# Patient Record
Sex: Female | Born: 1985 | Race: White | Hispanic: No | Marital: Single | Smoking: Current every day smoker
Health system: Southern US, Community
[De-identification: ages and names within clinical notes are randomized; demographics above are authoritative.]

## PROBLEM LIST (undated history)

## (undated) DIAGNOSIS — E538 Deficiency of other specified B group vitamins: Secondary | ICD-10-CM

## (undated) DIAGNOSIS — E559 Vitamin D deficiency, unspecified: Secondary | ICD-10-CM

## (undated) DIAGNOSIS — F419 Anxiety disorder, unspecified: Secondary | ICD-10-CM

## (undated) DIAGNOSIS — F32A Depression, unspecified: Secondary | ICD-10-CM

## (undated) DIAGNOSIS — K219 Gastro-esophageal reflux disease without esophagitis: Secondary | ICD-10-CM

## (undated) DIAGNOSIS — I10 Essential (primary) hypertension: Secondary | ICD-10-CM

## (undated) HISTORY — DX: Anxiety disorder, unspecified: F41.9

## (undated) HISTORY — DX: Vitamin D deficiency, unspecified: E55.9

## (undated) HISTORY — DX: Depression, unspecified: F32.A

## (undated) HISTORY — DX: Gastro-esophageal reflux disease without esophagitis: K21.9

## (undated) HISTORY — DX: Deficiency of other specified B group vitamins: E53.8

## (undated) HISTORY — PX: APPENDECTOMY: SHX54

## (undated) HISTORY — PX: CHOLECYSTECTOMY: SHX55

---

## 2018-03-08 ENCOUNTER — Emergency Department (HOSPITAL_COMMUNITY): Payer: No Typology Code available for payment source

## 2018-03-08 ENCOUNTER — Other Ambulatory Visit: Payer: Self-pay

## 2018-03-08 ENCOUNTER — Emergency Department (HOSPITAL_COMMUNITY)
Admission: EM | Admit: 2018-03-08 | Discharge: 2018-03-08 | Disposition: A | Discharge status: Home / Self Care | Payer: No Typology Code available for payment source | Attending: Emergency Medicine | Admitting: Emergency Medicine

## 2018-03-08 ENCOUNTER — Encounter (HOSPITAL_COMMUNITY): Payer: Self-pay

## 2018-03-08 DIAGNOSIS — S0990XA Unspecified injury of head, initial encounter: Secondary | ICD-10-CM | POA: Diagnosis present

## 2018-03-08 DIAGNOSIS — R040 Epistaxis: Secondary | ICD-10-CM | POA: Diagnosis not present

## 2018-03-08 DIAGNOSIS — R109 Unspecified abdominal pain: Secondary | ICD-10-CM | POA: Diagnosis not present

## 2018-03-08 DIAGNOSIS — Y92410 Unspecified street and highway as the place of occurrence of the external cause: Secondary | ICD-10-CM | POA: Diagnosis not present

## 2018-03-08 DIAGNOSIS — S022XXA Fracture of nasal bones, initial encounter for closed fracture: Secondary | ICD-10-CM

## 2018-03-08 DIAGNOSIS — Y998 Other external cause status: Secondary | ICD-10-CM | POA: Diagnosis not present

## 2018-03-08 DIAGNOSIS — R079 Chest pain, unspecified: Secondary | ICD-10-CM | POA: Insufficient documentation

## 2018-03-08 DIAGNOSIS — S8001XA Contusion of right knee, initial encounter: Secondary | ICD-10-CM | POA: Insufficient documentation

## 2018-03-08 DIAGNOSIS — S20211A Contusion of right front wall of thorax, initial encounter: Secondary | ICD-10-CM | POA: Insufficient documentation

## 2018-03-08 DIAGNOSIS — S02401A Maxillary fracture, unspecified, initial encounter for closed fracture: Secondary | ICD-10-CM | POA: Insufficient documentation

## 2018-03-08 DIAGNOSIS — S81012A Laceration without foreign body, left knee, initial encounter: Secondary | ICD-10-CM | POA: Insufficient documentation

## 2018-03-08 DIAGNOSIS — Y9389 Activity, other specified: Secondary | ICD-10-CM | POA: Insufficient documentation

## 2018-03-08 DIAGNOSIS — I10 Essential (primary) hypertension: Secondary | ICD-10-CM | POA: Diagnosis not present

## 2018-03-08 HISTORY — DX: Essential (primary) hypertension: I10

## 2018-03-08 LAB — COMPREHENSIVE METABOLIC PANEL
ALT: 37 U/L (ref 14–54)
ANION GAP: 12 (ref 5–15)
AST: 62 U/L — ABNORMAL HIGH (ref 15–41)
Albumin: 4.2 g/dL (ref 3.5–5.0)
Alkaline Phosphatase: 64 U/L (ref 38–126)
BUN: <5 mg/dL — ABNORMAL LOW (ref 6–20)
CHLORIDE: 106 mmol/L (ref 101–111)
CO2: 20 mmol/L — AB (ref 22–32)
Calcium: 9 mg/dL (ref 8.9–10.3)
Creatinine, Ser: 0.63 mg/dL (ref 0.44–1.00)
GFR calc non Af Amer: >60 mL/min (ref >60)
Glucose, Bld: 113 mg/dL — ABNORMAL HIGH (ref 65–99)
POTASSIUM: 3.7 mmol/L (ref 3.5–5.1)
SODIUM: 138 mmol/L (ref 135–145)
Total Bilirubin: 0.6 mg/dL (ref 0.3–1.2)
Total Protein: 7.3 g/dL (ref 6.5–8.1)

## 2018-03-08 LAB — CBC
HEMATOCRIT: 42.7 % (ref 36.0–46.0)
HEMOGLOBIN: 13.6 g/dL (ref 12.0–15.0)
MCH: 28.2 pg (ref 26.0–34.0)
MCHC: 31.9 g/dL (ref 30.0–36.0)
MCV: 88.6 fL (ref 78.0–100.0)
Platelets: 318 10*3/uL (ref 150–400)
RBC: 4.82 MIL/uL (ref 3.87–5.11)
RDW: 14.6 % (ref 11.5–15.5)
WBC: 11.2 10*3/uL — ABNORMAL HIGH (ref 4.0–10.5)

## 2018-03-08 LAB — I-STAT CHEM 8, ED
BUN: 6 mg/dL (ref 6–20)
CALCIUM ION: 1.03 mmol/L — AB (ref 1.15–1.40)
Chloride: 108 mmol/L (ref 101–111)
Creatinine, Ser: 0.6 mg/dL (ref 0.44–1.00)
Glucose, Bld: 117 mg/dL — ABNORMAL HIGH (ref 65–99)
HCT: 43 % (ref 36.0–46.0)
HEMOGLOBIN: 14.6 g/dL (ref 12.0–15.0)
POTASSIUM: 4.5 mmol/L (ref 3.5–5.1)
Sodium: 139 mmol/L (ref 135–145)
TCO2: 23 mmol/L (ref 22–32)

## 2018-03-08 LAB — SAMPLE TO BLOOD BANK

## 2018-03-08 LAB — I-STAT BETA HCG BLOOD, ED (MC, WL, AP ONLY)

## 2018-03-08 LAB — PROTIME-INR
INR: 0.98
Prothrombin Time: 12.9 seconds (ref 11.4–15.2)

## 2018-03-08 LAB — ETHANOL: Alcohol, Ethyl (B): <10 mg/dL (ref <10)

## 2018-03-08 LAB — CDS SEROLOGY

## 2018-03-08 LAB — I-STAT CG4 LACTIC ACID, ED: Lactic Acid, Venous: 1.58 mmol/L (ref 0.5–1.9)

## 2018-03-08 MED ORDER — ONDANSETRON HCL 4 MG/2ML IJ SOLN: 4.0000 mg | Freq: Once | INTRAMUSCULAR | Status: DC

## 2018-03-08 MED ORDER — IBUPROFEN 800 MG PO TABS: 800.0000 mg | ORAL_TABLET | Freq: Three times a day (TID) | ORAL | 0 refills | Status: DC | PRN

## 2018-03-08 MED ORDER — TETANUS-DIPHTH-ACELL PERTUSSIS 5-2.5-18.5 LF-MCG/0.5 IM SUSP
0.5000 mL | Freq: Once | INTRAMUSCULAR | Status: AC
  Administered 2018-03-08: 0.5 mL via INTRAMUSCULAR
  Filled 2018-03-08: qty 0.5

## 2018-03-08 MED ORDER — IOPAMIDOL (ISOVUE-300) INJECTION 61%
INTRAVENOUS | Status: AC
  Administered 2018-03-08: 100 mL
  Filled 2018-03-08: qty 100

## 2018-03-08 MED ORDER — MORPHINE SULFATE (PF) 4 MG/ML IV SOLN: 4.0000 mg | Freq: Once | INTRAVENOUS | Status: DC

## 2018-03-08 MED ORDER — HYDROCODONE-ACETAMINOPHEN 5-325 MG PO TABS: 1.0000 | ORAL_TABLET | Freq: Four times a day (QID) | ORAL | 0 refills | Status: DC | PRN

## 2018-03-08 NOTE — ED Notes (Signed)
Pt returned from CT. Pt AOX4, vitals remain stable.
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
## 2018-03-08 NOTE — ED Notes (Signed)
Pt called family member, Jonny RuizJohn and notified him that she is here. Phone [phone]) [phone]

## 2018-03-08 NOTE — ED Notes (Signed)
Pt ambulated to the restroom. MD made aware.
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
## 2018-12-14 ENCOUNTER — Encounter (HOSPITAL_COMMUNITY): Payer: Self-pay

## 2018-12-14 ENCOUNTER — Other Ambulatory Visit: Payer: Self-pay

## 2018-12-14 DIAGNOSIS — R51 Headache: Secondary | ICD-10-CM | POA: Insufficient documentation

## 2018-12-14 DIAGNOSIS — L02412 Cutaneous abscess of left axilla: Secondary | ICD-10-CM | POA: Insufficient documentation

## 2018-12-14 DIAGNOSIS — F17200 Nicotine dependence, unspecified, uncomplicated: Secondary | ICD-10-CM | POA: Insufficient documentation

## 2018-12-14 DIAGNOSIS — I10 Essential (primary) hypertension: Secondary | ICD-10-CM | POA: Insufficient documentation

## 2018-12-14 DIAGNOSIS — H5711 Ocular pain, right eye: Secondary | ICD-10-CM | POA: Insufficient documentation

## 2018-12-14 DIAGNOSIS — J320 Chronic maxillary sinusitis: Secondary | ICD-10-CM | POA: Insufficient documentation

## 2018-12-14 DIAGNOSIS — Z79899 Other long term (current) drug therapy: Secondary | ICD-10-CM | POA: Insufficient documentation

## 2018-12-14 NOTE — ED Triage Notes (Signed)
Pt c/o of multiple abscess under left axilla; some  open sores are noted, with mild serous drainage  Pt reports hx of HTN and states that she has not been taking BP medication and has pain to rt eye.

## 2018-12-15 ENCOUNTER — Emergency Department (HOSPITAL_COMMUNITY)
Admission: EM | Admit: 2018-12-15 | Discharge: 2018-12-15 | Disposition: A | Discharge status: Home / Self Care | Payer: Self-pay | Attending: Emergency Medicine | Admitting: Emergency Medicine

## 2018-12-15 ENCOUNTER — Encounter (HOSPITAL_COMMUNITY): Payer: Self-pay

## 2018-12-15 ENCOUNTER — Emergency Department (HOSPITAL_COMMUNITY): Payer: Self-pay

## 2018-12-15 DIAGNOSIS — L02412 Cutaneous abscess of left axilla: Secondary | ICD-10-CM

## 2018-12-15 DIAGNOSIS — J320 Chronic maxillary sinusitis: Secondary | ICD-10-CM

## 2018-12-15 DIAGNOSIS — H5711 Ocular pain, right eye: Secondary | ICD-10-CM

## 2018-12-15 DIAGNOSIS — I10 Essential (primary) hypertension: Secondary | ICD-10-CM

## 2018-12-15 LAB — BASIC METABOLIC PANEL
Anion gap: 7 (ref 5–15)
BUN: 9 mg/dL (ref 6–20)
CO2: 25 mmol/L (ref 22–32)
Calcium: 8.6 mg/dL — ABNORMAL LOW (ref 8.9–10.3)
Chloride: 107 mmol/L (ref 98–111)
Creatinine, Ser: 0.52 mg/dL (ref 0.44–1.00)
GFR calc Af Amer: >60 mL/min (ref >60)
GFR calc non Af Amer: >60 mL/min (ref >60)
Glucose, Bld: 116 mg/dL — ABNORMAL HIGH (ref 70–99)
Potassium: 3.8 mmol/L (ref 3.5–5.1)
Sodium: 139 mmol/L (ref 135–145)

## 2018-12-15 LAB — CBC WITH DIFFERENTIAL/PLATELET
Abs Immature Granulocytes: 0.04 10*3/uL (ref 0.00–0.07)
Basophils Absolute: 0.1 10*3/uL (ref 0.0–0.1)
Basophils Relative: 1 %
Eosinophils Absolute: 0.1 10*3/uL (ref 0.0–0.5)
Eosinophils Relative: 1 %
HCT: 43 % (ref 36.0–46.0)
Hemoglobin: 14 g/dL (ref 12.0–15.0)
Immature Granulocytes: 0 %
Lymphocytes Relative: 17 %
Lymphs Abs: 1.7 10*3/uL (ref 0.7–4.0)
MCH: 29.7 pg (ref 26.0–34.0)
MCHC: 32.6 g/dL (ref 30.0–36.0)
MCV: 91.1 fL (ref 80.0–100.0)
Monocytes Absolute: 0.5 10*3/uL (ref 0.1–1.0)
Monocytes Relative: 5 %
Neutro Abs: 7.9 10*3/uL — ABNORMAL HIGH (ref 1.7–7.7)
Neutrophils Relative %: 76 %
Platelets: 370 10*3/uL (ref 150–400)
RBC: 4.72 MIL/uL (ref 3.87–5.11)
RDW: 13.2 % (ref 11.5–15.5)
WBC: 10.4 10*3/uL (ref 4.0–10.5)
nRBC: 0 % (ref 0.0–0.2)

## 2018-12-15 MED ORDER — DEXAMETHASONE SODIUM PHOSPHATE 10 MG/ML IJ SOLN
10.0000 mg | Freq: Once | INTRAMUSCULAR | Status: AC
  Administered 2018-12-15: 10 mg via INTRAVENOUS
  Filled 2018-12-15: qty 1

## 2018-12-15 MED ORDER — HYDROCHLOROTHIAZIDE 12.5 MG PO CAPS
12.5000 mg | ORAL_CAPSULE | Freq: Once | ORAL | Status: AC
  Administered 2018-12-15: 12.5 mg via ORAL
  Filled 2018-12-15: qty 1

## 2018-12-15 MED ORDER — DOXYCYCLINE HYCLATE 100 MG PO CAPS: 100.0000 mg | ORAL_CAPSULE | Freq: Two times a day (BID) | ORAL | 0 refills | Status: AC

## 2018-12-15 MED ORDER — LIDOCAINE-EPINEPHRINE (PF) 2 %-1:200000 IJ SOLN
20.0000 mL | Freq: Once | INTRAMUSCULAR | Status: AC
  Administered 2018-12-15: 10 mL via INTRADERMAL
  Filled 2018-12-15: qty 20

## 2018-12-15 MED ORDER — LIDOCAINE-EPINEPHRINE-TETRACAINE (LET) SOLUTION
3.0000 mL | Freq: Once | NASAL | Status: DC
Start: 2018-12-15 — End: 2018-12-15
  Filled 2018-12-15: qty 3

## 2018-12-15 MED ORDER — FLUORESCEIN SODIUM 1 MG OP STRP
1.0000 | ORAL_STRIP | Freq: Once | OPHTHALMIC | Status: AC
  Administered 2018-12-15: 1 via OPHTHALMIC
  Filled 2018-12-15: qty 1

## 2018-12-15 MED ORDER — DOXYCYCLINE HYCLATE 100 MG PO TABS
100.0000 mg | ORAL_TABLET | Freq: Once | ORAL | Status: AC
  Administered 2018-12-15: 100 mg via ORAL
  Filled 2018-12-15: qty 1

## 2018-12-15 MED ORDER — ONDANSETRON 4 MG PO TBDP
4.0000 mg | ORAL_TABLET | Freq: Once | ORAL | Status: AC
  Administered 2018-12-15: 4 mg via ORAL
  Filled 2018-12-15: qty 1

## 2018-12-15 MED ORDER — DIPHENHYDRAMINE HCL 50 MG/ML IJ SOLN
25.0000 mg | Freq: Once | INTRAMUSCULAR | Status: AC
  Administered 2018-12-15: 25 mg via INTRAVENOUS
  Filled 2018-12-15: qty 1

## 2018-12-15 MED ORDER — HYDROCHLOROTHIAZIDE 25 MG PO TABS: 25.0000 mg | ORAL_TABLET | Freq: Every day | ORAL | 0 refills | Status: DC

## 2018-12-15 MED ORDER — FLUTICASONE PROPIONATE 50 MCG/ACT NA SUSP: 2.0000 | Freq: Every day | NASAL | 0 refills | Status: DC

## 2018-12-15 MED ORDER — LISINOPRIL 10 MG PO TABS: 10.0000 mg | ORAL_TABLET | Freq: Every day | ORAL | 0 refills | Status: DC

## 2018-12-15 MED ORDER — TETRACAINE HCL 0.5 % OP SOLN
2.0000 [drp] | Freq: Once | OPHTHALMIC | Status: AC
  Administered 2018-12-15: 2 [drp] via OPHTHALMIC
  Filled 2018-12-15: qty 4

## 2018-12-15 MED ORDER — PROCHLORPERAZINE EDISYLATE 10 MG/2ML IJ SOLN
10.0000 mg | Freq: Once | INTRAMUSCULAR | Status: AC
  Administered 2018-12-15: 10 mg via INTRAVENOUS
  Filled 2018-12-15: qty 2

## 2018-12-15 MED ORDER — LISINOPRIL 10 MG PO TABS
10.0000 mg | ORAL_TABLET | Freq: Once | ORAL | Status: AC
  Administered 2018-12-15: 10 mg via ORAL
  Filled 2018-12-15: qty 1

## 2018-12-15 NOTE — ED Provider Notes (Signed)
[facility]-EMERGENCY DEPT Provider Note   CSN: [id number] Arrival date & time: 12/14/18  2211     History   Chief Complaint Chief Complaint  Patient presents with  . Abscess    HPI Suzanne Casey is a 32 y.o. female with history of hypertension presents for evaluation of multiple complaints.  She reports that for the last 2 weeks she has had acute onset, progressively worsening pain and swelling of nodules to the left axilla.  She notes that the nodules have been spreading and will occasionally drain purulent fluid.  She notes some tenderness which worsens with palpation.  She now has some spread of erythema.  Denies fevers.  Has been taking ibuprofen and Tylenol without significant relief.  Also notes acute onset of sharp pulling pain to the right eye beginning while in the ED.  Denies injury, does not wear contact lenses.  Denies vision changes.  No aggravating or alleviating factors noted.  No pain with eye movements.  Denies photophobia.  Has not tried anything for her symptoms but thinks it may be related to her blood pressure as she has been out of her blood pressure medications for the last month.  The history is provided by the patient.    Past Medical History:  Diagnosis Date  . Hypertension     There are no active problems to display for this patient.   Past Surgical History:  Procedure Laterality Date  . APPENDECTOMY    . CHOLECYSTECTOMY       OB History   No obstetric history on file.      Home Medications    Prior to Admission medications   Medication Sig Start Date End Date Taking? Authorizing Provider  acetaminophen (TYLENOL) 325 MG tablet Take 650 mg by mouth daily as needed for pain.   Yes [provider]  levonorgestrel (MIRENA) 20 MCG/24HR IUD 1 Intra Uterine Device by Intrauterine route continuous.    Yes [provider]  doxycycline (VIBRAMYCIN) 100 MG capsule Take 1 capsule (100 mg total) by mouth 2 (two)  times daily for 7 days. 12/15/18 12/22/18  Michela PitcherFawze, Fred Franzen A, PA-C  fluticasone (FLONASE) 50 MCG/ACT nasal spray Place 2 sprays into both nostrils daily. 12/15/18   Akeya Ryther A, PA-C  hydrochlorothiazide (HYDRODIURIL) 25 MG tablet Take 1 tablet (25 mg total) by mouth daily. 12/15/18   Ariadne Rissmiller A, PA-C  HYDROcodone-acetaminophen (NORCO/VICODIN) 5-325 MG tablet Take 1-2 tablets by mouth every 6 (six) hours as needed for severe pain. Patient not taking: Reported on 12/15/2018 03/08/18   Terrilee FilesButler, Michael C, MD  ibuprofen (ADVIL,MOTRIN) 800 MG tablet Take 1 tablet (800 mg total) by mouth every 8 (eight) hours as needed for moderate pain. Patient not taking: Reported on 12/15/2018 03/08/18   Terrilee FilesButler, Michael C, MD  lisinopril (PRINIVIL,ZESTRIL) 10 MG tablet Take 1 tablet (10 mg total) by mouth daily. 12/15/18   Jeanie SewerFawze, Ikran Patman A, PA-C    Family History History reviewed. No pertinent family history.  Social History Social History   Tobacco Use  . Smoking status: Current Every Day Smoker  . Smokeless tobacco: Never Used  Substance Use Topics  . Alcohol use: No    Frequency: Never  . Drug use: Not on file     Allergies   Patient has no known allergies.   Review of Systems Review of Systems  Constitutional: Negative for chills and fever.  Eyes: Positive for pain, redness and itching. Negative for photophobia and visual disturbance.  Skin:  Positive for color change.       +abscess  Neurological: Positive for headaches.     Physical Exam Updated Vital Signs BP (!) 148/96   Pulse 83   Temp 98.2 F (36.8 C) (Oral)   Resp 16   Ht 5\' 7"  (1.702 m)   Wt 100.8 kg   LMP 11/05/2018   SpO2 95%   BMI 34.80 kg/m   Physical Exam Vitals signs and nursing note reviewed.  Constitutional:      General: She is not in acute distress.    Appearance: She is well-developed.  HENT:     Head: Normocephalic and atraumatic.  Eyes:     General:        Right eye: Discharge present.        Left eye:  No discharge.     Extraocular Movements: Extraocular movements intact.     Pupils: Pupils are equal, round, and reactive to light.     Comments: Right eye with injected conjunctive a and clear tearful drainage.  Mild periorbital swelling but no tenderness to palpation of the periorbital region.  No pain with EOMs or restricted EOMs.  No chemosis, proptosis, or consensual photophobia.  No foreign bodies noted.  On fluorescein stain, no dendritic lesions, no foreign bodies, no rust rings, no corneal ulcerations or abrasions.    Visual Acuity  Right Eye Distance: 20/30 Left Eye Distance: 20/25 Bilateral Distance:    Right Eye Near:   Left Eye Near:    Bilateral Near:  20/25   Neck:     Vascular: No JVD.     Trachea: No tracheal deviation.  Cardiovascular:     Rate and Rhythm: Normal rate.  Pulmonary:     Effort: Pulmonary effort is normal.  Abdominal:     General: There is no distension.  Skin:    General: Skin is warm and dry.     Findings: Abscess and erythema present.     Comments: See below image.  Multiple indurated erythematous nodules to the left axilla.  One nodule is draining purulent material which can be expressed.  There is surrounding erythema.  Neurological:     General: No focal deficit present.     Mental Status: She is alert and oriented to person, place, and time.     Comments: Mental Status:  Alert, thought content appropriate, able to give a coherent history. Speech fluent without evidence of aphasia. Able to follow 2 step commands without difficulty.  Cranial Nerves:  II:  Peripheral visual fields grossly normal, pupils equal, round, reactive to light III,IV, VI: ptosis not present, extra-ocular motions intact bilaterally  V,VII: smile symmetric, facial light touch sensation equal VIII: hearing grossly normal to voice  X: uvula elevates symmetrically  XI: bilateral shoulder shrug symmetric and strong XII: midline tongue extension without  fassiculations Motor:  Normal tone. 5/5 strength of BUE and BLE major muscle groups including strong and equal grip strength and dorsiflexion/plantar flexion Sensory: light touch normal in all extremities. Cerebellar: normal finger-to-nose with bilateral upper extremities Gait: normal gait and balance. Able to walk on toes and heels with ease.  No disconjugate gaze.  No pronator drift.  Psychiatric:        Behavior: Behavior normal.        ED Treatments / Results  Labs (all labs ordered are listed, but only abnormal results are displayed) Labs Reviewed  CBC WITH DIFFERENTIAL/PLATELET - Abnormal; Notable for the following components:      Result Value  Neutro Abs 7.9 (*)    All other components within normal limits  BASIC METABOLIC PANEL - Abnormal; Notable for the following components:   Glucose, Bld 116 (*)    Calcium 8.6 (*)    All other components within normal limits    EKG None  Radiology Ct Head Wo Contrast  Result Date: 12/15/2018 CLINICAL DATA:  Worst headache of life, retro-orbital pain. History of hypertension. EXAM: CT HEAD AND ORBITS WITHOUT CONTRAST TECHNIQUE: Contiguous axial images were obtained from the base of the skull through the vertex without contrast. Multidetector CT imaging of the orbits was performed using the standard protocol without intravenous contrast. COMPARISON:  CT HEAD and CT maxillofacial March 08, 2018 FINDINGS: CT HEAD FINDINGS BRAIN: No intraparenchymal hemorrhage, mass effect nor midline shift. The ventricles and sulci are normal. No acute large vascular territory infarcts. No abnormal extra-axial fluid collections. Basal cisterns are patent. VASCULAR: Unremarkable. SKULL/SOFT TISSUES: No skull fracture. No significant soft tissue swelling. OTHER: None. CT ORBITS FINDINGS ORBITS: Intact ocular globes. Dysconjugate gaze may be transient. Lenses are located. Normal appearance of the optic nerve sheath complexes. Preservation of the orbital  fat. Normal appearance of the extraocular muscles which are well located. Superior ophthalmic veins are not enlarged. VISUALIZED SINUSES: Worsening acute on chronic RIGHT maxillary sinusitis with bony remodeling, effaced RIGHT ostiomeatal unit. Mild ethmoid mucosal thickening. Nasal septum deviated to the LEFT, old fracture. Bilateral concha bullosa. SOFT TISSUES/BONES: No significant soft tissue swelling, no subcutaneous gas or radiopaque foreign bodies. No destructive bony lesions. Punctate bilateral submandibular sialoliths. IMPRESSION: CT HEAD: 1. Normal noncontrast CT HEAD. CT ORBITS: 1. Normal non-contrast CT ORBITS. 2. Worsening acute on chronic RIGHT maxillary sinusitis, obstructed RIGHT ostiomeatal unit. Electronically Signed   By: Awilda Metro M.D.   On: 12/15/2018 04:34   Ct Orbitss W/o Cm  Result Date: 12/15/2018 CLINICAL DATA:  Worst headache of life, retro-orbital pain. History of hypertension. EXAM: CT HEAD AND ORBITS WITHOUT CONTRAST TECHNIQUE: Contiguous axial images were obtained from the base of the skull through the vertex without contrast. Multidetector CT imaging of the orbits was performed using the standard protocol without intravenous contrast. COMPARISON:  CT HEAD and CT maxillofacial March 08, 2018 FINDINGS: CT HEAD FINDINGS BRAIN: No intraparenchymal hemorrhage, mass effect nor midline shift. The ventricles and sulci are normal. No acute large vascular territory infarcts. No abnormal extra-axial fluid collections. Basal cisterns are patent. VASCULAR: Unremarkable. SKULL/SOFT TISSUES: No skull fracture. No significant soft tissue swelling. OTHER: None. CT ORBITS FINDINGS ORBITS: Intact ocular globes. Dysconjugate gaze may be transient. Lenses are located. Normal appearance of the optic nerve sheath complexes. Preservation of the orbital fat. Normal appearance of the extraocular muscles which are well located. Superior ophthalmic veins are not enlarged. VISUALIZED SINUSES:  Worsening acute on chronic RIGHT maxillary sinusitis with bony remodeling, effaced RIGHT ostiomeatal unit. Mild ethmoid mucosal thickening. Nasal septum deviated to the LEFT, old fracture. Bilateral concha bullosa. SOFT TISSUES/BONES: No significant soft tissue swelling, no subcutaneous gas or radiopaque foreign bodies. No destructive bony lesions. Punctate bilateral submandibular sialoliths. IMPRESSION: CT HEAD: 1. Normal noncontrast CT HEAD. CT ORBITS: 1. Normal non-contrast CT ORBITS. 2. Worsening acute on chronic RIGHT maxillary sinusitis, obstructed RIGHT ostiomeatal unit. Electronically Signed   By: Awilda Metro M.D.   On: 12/15/2018 04:34    Procedures .Marland KitchenIncision and Drainage Date/Time: 12/15/2018 5:30 AM Performed by: Jeanie Sewer, PA-C Authorized by: Jeanie Sewer, PA-C   Consent:    Consent obtained:  Verbal  Consent given by:  Patient   Risks discussed:  Bleeding, incomplete drainage, pain and damage to other organs   Alternatives discussed:  No treatment Universal protocol:    Procedure explained and questions answered to patient or proxy's satisfaction: yes     Relevant documents present and verified: yes     Test results available and properly labeled: yes     Imaging studies available: yes     Required blood products, implants, devices, and special equipment available: yes     Site/side marked: yes     Immediately prior to procedure a time out was called: yes     Patient identity confirmed:  Verbally with patient Location:    Type:  Abscess   Size:  1x2cm   Location:  Upper extremity   Upper extremity location: left axilla. Pre-procedure details:    Skin preparation:  Betadine Anesthesia (see MAR for exact dosages):    Anesthesia method:  Local infiltration   Local anesthetic:  Lidocaine 2% WITH epi Procedure type:    Complexity:  Complex Procedure details:    Incision types:  Single straight   Incision depth:  Subcutaneous   Scalpel blade:  11   Wound  management:  Probed and deloculated, irrigated with saline and extensive cleaning   Drainage:  Purulent   Drainage amount:  Moderate   Packing materials:  1/4 in gauze Post-procedure details:    Patient tolerance of procedure:  Tolerated well, no immediate complications .Marland KitchenIncision and Drainage Date/Time: 12/15/2018 5:31 AM Performed by: Jeanie Sewer, PA-C Authorized by: Jeanie Sewer, PA-C   Consent:    Consent obtained:  Verbal   Consent given by:  Patient   Risks discussed:  Bleeding, incomplete drainage, pain and damage to other organs   Alternatives discussed:  No treatment Universal protocol:    Procedure explained and questions answered to patient or proxy's satisfaction: yes     Relevant documents present and verified: yes     Test results available and properly labeled: yes     Imaging studies available: yes     Required blood products, implants, devices, and special equipment available: yes     Site/side marked: yes     Immediately prior to procedure a time out was called: yes     Patient identity confirmed:  Verbally with patient Location:    Type:  Abscess   Size:  1x1cm Pre-procedure details:    Skin preparation:  Betadine Anesthesia (see MAR for exact dosages):    Anesthesia method:  Local infiltration   Local anesthetic:  Lidocaine 2% WITH epi Procedure type:    Complexity:  Complex Procedure details:    Incision types:  Single straight   Incision depth:  Subcutaneous   Scalpel blade:  11   Wound management:  Probed and deloculated, irrigated with saline and extensive cleaning   Drainage:  Purulent   Drainage amount:  Scant   Packing materials:  1/4 in gauze Post-procedure details:    Patient tolerance of procedure:  Tolerated well, no immediate complications   (including critical care time)  Medications Ordered in ED Medications  fluorescein ophthalmic strip 1 strip (1 strip Right Eye Given 12/15/18 0603)  tetracaine (PONTOCAINE) 0.5 % ophthalmic  solution 2 drop (2 drops Right Eye Given 12/15/18 0603)  lidocaine-EPINEPHrine (XYLOCAINE W/EPI) 2 %-1:200000 (PF) injection 20 mL (10 mLs Intradermal Given 12/15/18 0602)  lisinopril (PRINIVIL,ZESTRIL) tablet 10 mg (10 mg Oral Given 12/15/18 0243)  hydrochlorothiazide (MICROZIDE) capsule 12.5 mg (12.5  mg Oral Given 12/15/18 0243)  ondansetron (ZOFRAN-ODT) disintegrating tablet 4 mg (4 mg Oral Given 12/15/18 0254)  dexamethasone (DECADRON) injection 10 mg (10 mg Intravenous Given 12/15/18 0348)  prochlorperazine (COMPAZINE) injection 10 mg (10 mg Intravenous Given 12/15/18 0348)  diphenhydrAMINE (BENADRYL) injection 25 mg (25 mg Intravenous Given 12/15/18 0348)  doxycycline (VIBRA-TABS) tablet 100 mg (100 mg Oral Given 12/15/18 0601)     Initial Impression / Assessment and Plan / ED Course  I have reviewed the triage vital signs and the nursing notes.  Pertinent labs & imaging results that were available during my care of the patient were reviewed by me and considered in my medical decision making (see chart for details).     Patient presenting for evaluation of left axillary abscesses.  Just prior to my assessment she did develop some severe left eye pain and headache.  She then became hypertensive while in the ED so lab work and head CT were obtained to rule out hypertensive emergency as she has been out of her hypertension medications for approximately 1 month.  She has an entirely normal neuro examination.  She was given migraine cocktail in the ED with significant improvement.  Imaging shows no acute intracranial abnormalities.  No evidence of mass, ICH, SAH, or CVA.  CT read does mention disconjugate gaze though this is not present on examination.  Imaging does show worsening acute on chronic right maxillary sinusitis and obstructed right ostiomeatal unit which is likely causing her conjunctival injection and clear tearful drainage.  No evidence of ocular nerve entrapment, corneal ulcer, HSV  ophthalmicus, or globe rupture.  On reassessment patient notes complete resolution of her headache.  We will refill her hypertension medications.  No evidence of endorgan damage.  Skin abscesses amenable to incision and drainage.  Some of the nodules on examination appear more indurated and are quite small and likely would not benefit from I&D but 2 areas did.  The abscesses were packed with iodoform gauze,  wound recheck in 2 days. Encouraged home warm soaks and flushing.  Given surrounding erythema will discharge with antibiotics.  First dose doxycycline given in the ED.  Recommend follow-up with PCP for reevaluation of hypertension.  Discussed indications for return to the ED sooner. Pt verbalized understanding of and agreement with plan and is safe for discharge home at this time.   Final Clinical Impressions(s) / ED Diagnoses   Final diagnoses:  Abscess of left axilla  Acute right eye pain  Right maxillary sinusitis  Hypertension, unspecified type    ED Discharge Orders         Ordered    hydrochlorothiazide (HYDRODIURIL) 25 MG tablet  Daily     12/15/18 0538    lisinopril (PRINIVIL,ZESTRIL) 10 MG tablet  Daily     12/15/18 0538    fluticasone (FLONASE) 50 MCG/ACT nasal spray  Daily     12/15/18 0538    doxycycline (VIBRAMYCIN) 100 MG capsule  2 times daily     12/15/18 0538           Jeanie Sewer, PA-C 12/16/18 0554    Lorre Nick, MD 12/16/18 1330

## 2018-12-15 NOTE — ED Notes (Signed)
Pt vomited ~300cc liquid brown emesis.

## 2018-12-15 NOTE — Discharge Instructions (Signed)
Please take all of your antibiotics until finished!   You may develop abdominal discomfort or diarrhea from the antibiotic.  You may help offset this with probiotics which you can buy or get in yogurt. Do not eat  or take the probiotics until 2 hours after your antibiotic.   Keep wound clean and dry. Apply warm compresses throughout the day. Alternate 600 mg of ibuprofen and [phone] mg of Tylenol every 3 hours as needed for pain. Do not exceed 4000 mg of Tylenol daily.  Use Flonase as needed for nasal congestion.  Your CT scan showed findings of right sided sinusitis.   [facility] 2 days for wound recheck and packing removal.  Return to emergency department for emergent changing or worsening symptoms such as fever, vomiting, worsening spread of redness.  If your blood pressure (BP) was elevated on multiple readings during this visit above 130 for the top number or above 80 for the bottom number, please have this repeated by your primary care provider within one month. You can also check your blood pressure when you are out at a pharmacy or grocery store. Many have machines that will check your blood pressure.  If your blood pressure remains elevated, please follow-up with your PCP.

## 2019-02-03 ENCOUNTER — Other Ambulatory Visit: Payer: Self-pay

## 2019-02-03 ENCOUNTER — Encounter (HOSPITAL_COMMUNITY): Payer: Self-pay | Admitting: Emergency Medicine

## 2019-02-03 ENCOUNTER — Emergency Department (HOSPITAL_COMMUNITY)
Admission: EM | Admit: 2019-02-03 | Discharge: 2019-02-03 | Disposition: A | Discharge status: Home / Self Care | Payer: Self-pay | Attending: Emergency Medicine | Admitting: Emergency Medicine

## 2019-02-03 DIAGNOSIS — R102 Pelvic and perineal pain: Secondary | ICD-10-CM

## 2019-02-03 DIAGNOSIS — F17210 Nicotine dependence, cigarettes, uncomplicated: Secondary | ICD-10-CM | POA: Insufficient documentation

## 2019-02-03 DIAGNOSIS — Z79899 Other long term (current) drug therapy: Secondary | ICD-10-CM | POA: Insufficient documentation

## 2019-02-03 DIAGNOSIS — I10 Essential (primary) hypertension: Secondary | ICD-10-CM | POA: Insufficient documentation

## 2019-02-03 DIAGNOSIS — N760 Acute vaginitis: Secondary | ICD-10-CM | POA: Insufficient documentation

## 2019-02-03 DIAGNOSIS — B9689 Other specified bacterial agents as the cause of diseases classified elsewhere: Secondary | ICD-10-CM

## 2019-02-03 LAB — URINALYSIS, ROUTINE W REFLEX MICROSCOPIC
BILIRUBIN URINE: NEGATIVE
Glucose, UA: NEGATIVE mg/dL
Ketones, ur: NEGATIVE mg/dL
Nitrite: NEGATIVE
Protein, ur: NEGATIVE mg/dL
Specific Gravity, Urine: 1.016 (ref 1.005–1.030)
pH: 6 (ref 5.0–8.0)

## 2019-02-03 LAB — WET PREP, GENITAL
Sperm: NONE SEEN
Trich, Wet Prep: NONE SEEN
Yeast Wet Prep HPF POC: NONE SEEN

## 2019-02-03 LAB — POC URINE PREG, ED: Preg Test, Ur: NEGATIVE

## 2019-02-03 MED ORDER — CEFTRIAXONE SODIUM 250 MG IJ SOLR
250.0000 mg | Freq: Once | INTRAMUSCULAR | Status: AC
  Administered 2019-02-03: 250 mg via INTRAMUSCULAR
  Filled 2019-02-03: qty 250

## 2019-02-03 MED ORDER — DOXYCYCLINE HYCLATE 100 MG PO CAPS: 100.0000 mg | ORAL_CAPSULE | Freq: Two times a day (BID) | ORAL | 0 refills | Status: DC

## 2019-02-03 MED ORDER — NAPROXEN 500 MG PO TABS: 500.0000 mg | ORAL_TABLET | Freq: Two times a day (BID) | ORAL | 0 refills | Status: DC

## 2019-02-03 MED ORDER — LIDOCAINE HCL (PF) 1 % IJ SOLN
INTRAMUSCULAR | Status: AC
  Administered 2019-02-03: 5 mL
  Filled 2019-02-03: qty 5

## 2019-02-03 MED ORDER — METRONIDAZOLE 500 MG PO TABS: 500.0000 mg | ORAL_TABLET | Freq: Two times a day (BID) | ORAL | 0 refills | Status: DC

## 2019-02-03 NOTE — ED Triage Notes (Signed)
Pt. Stated, Suzanne Casey had lower stomach pain for 3 days. It is close to my period but I usually have pain higher in my stomach.

## 2019-02-03 NOTE — ED Notes (Signed)
Patient verbalized understanding of discharge instructions and denies any further needs or questions at this time. VS stable. Patient ambulatory with steady gait.  

## 2019-02-03 NOTE — Discharge Instructions (Signed)
You are seen in the ER today for pelvic pain.  Your urine did not show a urinary tract infection.  Your pregnancy test was negative.  Your wet prep showed findings consistent with bacterial vaginosis, please see the attached handout regarding this diagnosis.  We are sending you home with Flagyl, antibiotic to treat this.  Do not drink alcohol while taking Flagyl as it can have serious side effects.  Also treating you for possible STDs related to pelvic inflammatory disease, we are treating this with doxycycline, an antibiotic.  We will call you with your gonorrhea, chlamydia, HIV, and syphilis results if they are positive, if negative you will not receive a phone call.  You also may also check online on MyChart.  Any of your STD screening test returned positive you will need to inform all sexual partners.  Please also follow-up with the health department if any of these tests are positive.  It is also very possible that your pelvic pain is due to menstrual cramps, we are treating this with naproxen.  Naproxen is a nonsteroidal anti-inflammatory medication that will help with pain and swelling. Be sure to take this medication as prescribed with food, 1 pill every 12 hours,  It should be taken with food, as it can cause stomach upset, and more seriously, stomach bleeding. Do not take other nonsteroidal anti-inflammatory medications with this such as Advil, Motrin, Aleve, Mobic, Goodie Powder, or Motrin.    You make take Tylenol per over the counter dosing with these medications.   We have prescribed you new medication(s) today. Discuss the medications prescribed today with your pharmacist as they can have adverse effects and interactions with your other medicines including over the counter and prescribed medications. Seek medical evaluation if you start to experience new or abnormal symptoms after taking one of these medicines, seek care immediately if you start to experience difficulty breathing, feeling of  your throat closing, facial swelling, or rash as these could be indications of a more serious allergic reaction  We would like you to follow-up with women's health within 3 to 5 days, return to the ER for new or worsening symptoms including but not limited to worsening pain, fevers, inability to keep fluids down, or any other concerns.

## 2019-02-03 NOTE — ED Provider Notes (Signed)
[name] EMERGENCY DEPARTMENT Provider Note   CSN: [id number] Arrival date & time: 02/03/19  1636     History   Chief Complaint Chief Complaint  Patient presents with  . Abdominal Pain    HPI Suzanne Casey is a 33 y.o. female with a hx of prior appendectomy & cholecystectomy who presents to the ED with complaints of pelvic pain x 3 days. Patient states pain is located in the bilateral suprapubic/pelvic region. Pain is intermittent, worse with movement, alleviated w/ tylenol. No pain at present. Reports this is the typical time for her menses, she states she is spotting, no heavy vaginal bleeding which is typical for her periods with her IUD in place. She notes she thought this may be period cramps, but she often gets those a bit higher up. She had some dysuria a few days ago but this has resolved. She denies fever, chills, nausea, vomiting, diarrhea, frequency, or urgency. Patient is sexually active with 1 female and 1 female partner. She states that she does not use protection with her female partner and that he recently informed her he tested positive for gonorrhea. She is not having any vaginal discharge. She did inform her other sexual partner. She has no prior history of personal STDs.   HPI  Past Medical History:  Diagnosis Date  . Hypertension     There are no active problems to display for this patient.   Past Surgical History:  Procedure Laterality Date  . APPENDECTOMY    . CHOLECYSTECTOMY       OB History   No obstetric history on file.      Home Medications    Prior to Admission medications   Medication Sig Start Date End Date Taking? Authorizing Provider  acetaminophen (TYLENOL) 325 MG tablet Take 650 mg by mouth daily as needed for pain.    [provider]  fluticasone (FLONASE) 50 MCG/ACT nasal spray Place 2 sprays into both nostrils daily. 12/15/18   Fawze, Mina A, PA-C  hydrochlorothiazide (HYDRODIURIL) 25 MG tablet Take 1 tablet  (25 mg total) by mouth daily. 12/15/18   Fawze, Mina A, PA-C  HYDROcodone-acetaminophen (NORCO/VICODIN) 5-325 MG tablet Take 1-2 tablets by mouth every 6 (six) hours as needed for severe pain. Patient not taking: Reported on 12/15/2018 03/08/18   Terrilee Files, MD  ibuprofen (ADVIL,MOTRIN) 800 MG tablet Take 1 tablet (800 mg total) by mouth every 8 (eight) hours as needed for moderate pain. Patient not taking: Reported on 12/15/2018 03/08/18   Terrilee Files, MD  levonorgestrel [facility]) 20 MCG/24HR IUD 1 Intra Uterine Device by Intrauterine route continuous.     [provider]  lisinopril (PRINIVIL,ZESTRIL) 10 MG tablet Take 1 tablet (10 mg total) by mouth daily. 12/15/18   Jeanie Sewer, PA-C    Family History History reviewed. No pertinent family history.  Social History Social History   Tobacco Use  . Smoking status: Current Every Day Smoker  . Smokeless tobacco: Never Used  Substance Use Topics  . Alcohol use: No    Frequency: Never  . Drug use: Not on file     Allergies   Patient has no known allergies.   Review of Systems Review of Systems  Constitutional: Negative for chills and fever.  Respiratory: Negative for shortness of breath.   Cardiovascular: Negative for chest pain.  Gastrointestinal: Positive for abdominal pain. Negative for blood in stool, constipation, diarrhea, nausea and vomiting.  Genitourinary: Positive for dysuria (resolved at present),  pelvic pain and vaginal bleeding. Negative for frequency, urgency and vaginal discharge.  All other systems reviewed and are negative.    Physical Exam Updated Vital Signs BP 132/86 (BP Location: Left Arm)   Pulse 98   Temp 97.6 F (36.4 C) (Oral)   Resp 18   Ht 5\' 7"  (1.702 m)   Wt 111.1 kg   SpO2 99%   BMI 38.37 kg/m   Physical Exam Vitals signs and nursing note reviewed. Exam conducted with a chaperone present.  Constitutional:      General: She is not in acute distress.    Appearance:  She is well-developed. She is not toxic-appearing.  HENT:     Head: Normocephalic and atraumatic.  Eyes:     General:        Right eye: No discharge.        Left eye: No discharge.     Conjunctiva/sclera: Conjunctivae normal.  Neck:     Musculoskeletal: Neck supple.  Cardiovascular:     Rate and Rhythm: Normal rate and regular rhythm.  Pulmonary:     Effort: Pulmonary effort is normal. No respiratory distress.     Breath sounds: Normal breath sounds. No wheezing, rhonchi or rales.  Abdominal:     General: There is no distension.     Palpations: Abdomen is soft.     Tenderness: There is no abdominal tenderness. There is no guarding or rebound.  Genitourinary:    Exam position: Supine.     Labia:        Right: No lesion.        Left: No lesion.      Vagina: No vaginal discharge.     Cervix: No cervical motion tenderness, discharge, friability or erythema.     Adnexa:        Right: No mass, tenderness or fullness.         Left: No mass, tenderness or fullness.       Comments: Mild bleeding from cervical os.  IUD strings visible from cervical os.  Skin:    General: Skin is warm and dry.     Findings: No rash.  Neurological:     Mental Status: She is alert.     Comments: Clear speech.   Psychiatric:        Behavior: Behavior normal.      ED Treatments / Results  Labs (all labs ordered are listed, but only abnormal results are displayed) Labs Reviewed  WET PREP, GENITAL - Abnormal; Notable for the following components:      Result Value   Clue Cells Wet Prep HPF POC PRESENT (*)    WBC, Wet Prep HPF POC MANY (*)    All other components within normal limits  URINALYSIS, ROUTINE W REFLEX MICROSCOPIC - Abnormal; Notable for the following components:   Hgb urine dipstick MODERATE (*)    Leukocytes, UA TRACE (*)    Bacteria, UA RARE (*)    All other components within normal limits  URINE CULTURE  RPR  HIV ANTIBODY (ROUTINE TESTING W REFLEX)  POC URINE PREG, ED    GC/CHLAMYDIA PROBE AMP ([place]    EKG None  Radiology No results found.  Procedures Procedures (including critical care time)  Medications Ordered in ED Medications  cefTRIAXone (ROCEPHIN) injection 250 mg (has no administration in time range)     Initial Impression / Assessment and Plan / ED Course  I have reviewed the triage vital signs and the  nursing notes.  Pertinent labs & imaging results that were available during my care of the patient were reviewed by me and considered in my medical decision making (see chart for details).   Patient presents to the ER with intermittent pelvic pain x 3 days. Nontoxic appearing, in no apparent distress, vitals WNL. Benign exam, no abdominal tenderness or peritoneal signs, speculum exam w/ visualization of IUD strings, mild amount of bleeding consistent with history of menses, no significant discharge, no CMT or adnexal tenderness.    Work-up reviewed:  UA: Not consistent with UTI, given hx of some urinary sxs initially will culture Preg test: negative- doubt ectopic Wet prep: BV GC/chlamydia/HIV/RPR: Pending.   History w/ bilateral pain, mild in nature, does not seem consistent with torsion.  Suspicion for cramping related to menses as underlying etiology, patient without significant vaginal discharge, however she does have known exposure to gonorrhea, given intermittent pelvic pain with known STD exposure feel that coverage for PID is warranted.  Ceftriaxone given in the ER with prescription for doxycycline.  Will also cover for bacterial vaginosis with Flagyl. Naproxen for pain. obgyn follow up.  Patient aware she has STD tests pending and will need to inform all sexual partners if positive.  Discussed the importance of protection when sexually active.  I discussed results, treatment plan, need for follow-up, and return precautions with the patient. Provided opportunity for questions, patient confirmed understanding and is  in agreement with plan.    Final Clinical Impressions(s) / ED Diagnoses   Final diagnoses:  Pelvic pain  Bacterial vaginosis    ED Discharge Orders         Ordered    metroNIDAZOLE (FLAGYL) 500 MG tablet  2 times daily     02/03/19 1828    doxycycline (VIBRAMYCIN) 100 MG capsule  2 times daily     02/03/19 1828    naproxen (NAPROSYN) 500 MG tablet  2 times daily     02/03/19 [address]           Jaeli Grubb R, PA-C 02/03/19 1845    Linwood DibblesKnapp, Jon, MD 02/04/19 [phone]

## 2019-02-04 LAB — HIV ANTIBODY (ROUTINE TESTING W REFLEX): HIV Screen 4th Generation wRfx: NONREACTIVE

## 2019-02-04 LAB — RPR: RPR Ser Ql: NONREACTIVE

## 2019-02-05 LAB — URINE CULTURE

## 2019-02-06 LAB — GC/CHLAMYDIA PROBE AMP (~~LOC~~) NOT AT ARMC
Chlamydia: NEGATIVE
Neisseria Gonorrhea: POSITIVE — AB
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
## 2021-05-16 ENCOUNTER — Ambulatory Visit (HOSPITAL_COMMUNITY)
Admission: EM | Admit: 2021-05-16 | Discharge: 2021-05-16 | Disposition: A | Discharge status: Home / Self Care | Payer: Self-pay | Attending: Emergency Medicine | Admitting: Emergency Medicine

## 2021-05-16 ENCOUNTER — Encounter (HOSPITAL_COMMUNITY): Payer: Self-pay

## 2021-05-16 ENCOUNTER — Other Ambulatory Visit: Payer: Self-pay

## 2021-05-16 DIAGNOSIS — Z20822 Contact with and (suspected) exposure to covid-19: Secondary | ICD-10-CM | POA: Insufficient documentation

## 2021-05-16 DIAGNOSIS — R509 Fever, unspecified: Secondary | ICD-10-CM | POA: Insufficient documentation

## 2021-05-16 DIAGNOSIS — J029 Acute pharyngitis, unspecified: Secondary | ICD-10-CM | POA: Insufficient documentation

## 2021-05-16 DIAGNOSIS — F17200 Nicotine dependence, unspecified, uncomplicated: Secondary | ICD-10-CM | POA: Insufficient documentation

## 2021-05-16 LAB — POCT RAPID STREP A, ED / UC: Streptococcus, Group A Screen (Direct): POSITIVE — AB

## 2021-05-16 MED ORDER — AMOXICILLIN 875 MG PO TABS: 875.0000 mg | ORAL_TABLET | Freq: Two times a day (BID) | ORAL | 0 refills | Status: AC

## 2021-05-16 NOTE — ED Triage Notes (Signed)
Pt c/o sore throat and fever X 3 days  Pt states she has seen white spots on her throat.

## 2021-05-16 NOTE — Discharge Instructions (Signed)
Take Amoxicillin twice daily for ten days.  

## 2021-05-16 NOTE — ED Provider Notes (Signed)
[facility]  ____________________________________________  Time seen: Approximately 8:08 PM  I have reviewed the triage vital signs and the nursing notes.   HISTORY  Chief Complaint Sore Throat and Fever   Historian Patient     HPI Suzanne Casey is a 35 y.o. female presents to the urgent care with pharyngitis.  Patient has also noticed tonsillar exudate.  No significant cough, nasal congestion or rhinorrhea.  Patient has had low-grade fever for the past 2 days.  No pain underneath the tongue or difficulty swallowing.  No prior history of peritonsillar abscess.   Past Medical History:  Diagnosis Date  . Hypertension      Immunizations up to date:  Yes.     Past Medical History:  Diagnosis Date  . Hypertension     There are no problems to display for this patient.   Past Surgical History:  Procedure Laterality Date  . APPENDECTOMY    . CHOLECYSTECTOMY      Prior to Admission medications   Medication Sig Start Date End Date Taking? Authorizing Provider  amoxicillin (AMOXIL) 875 MG tablet Take 1 tablet (875 mg total) by mouth 2 (two) times daily for 10 days. 05/16/21 05/26/21 Yes Pia Mau M, PA-C  acetaminophen (TYLENOL) 325 MG tablet Take 650 mg by mouth daily as needed for pain.    [provider]  fluticasone (FLONASE) 50 MCG/ACT nasal spray Place 2 sprays into both nostrils daily. 12/15/18   Fawze, Mina A, PA-C  hydrochlorothiazide (HYDRODIURIL) 25 MG tablet Take 1 tablet (25 mg total) by mouth daily. 12/15/18   Fawze, Mina A, PA-C  lisinopril (PRINIVIL,ZESTRIL) 10 MG tablet Take 1 tablet (10 mg total) by mouth daily. 12/15/18   Fawze, Mina A, PA-C  metroNIDAZOLE (FLAGYL) 500 MG tablet Take 1 tablet (500 mg total) by mouth 2 (two) times daily. 02/03/19   Petrucelli, Samantha R, PA-C  naproxen (NAPROSYN) 500 MG tablet Take 1 tablet (500 mg total) by mouth 2 (two) times daily. 02/03/19   Petrucelli, Pleas Koch, PA-C    Allergies Patient has  no known allergies.  History reviewed. No pertinent family history.  Social History Social History   Tobacco Use  . Smoking status: Current Every Day Smoker  . Smokeless tobacco: Never Used  Substance Use Topics  . Alcohol use: No     Review of Systems  Constitutional: No fever/chills Eyes:  No discharge ENT: Patient has pharyngitis. Respiratory: no cough. No SOB/ use of accessory muscles to breath Gastrointestinal:   No nausea, no vomiting.  No diarrhea.  No constipation. Musculoskeletal: Negative for musculoskeletal pain. Skin: Negative for rash, abrasions, lacerations, ecchymosis.    ____________________________________________   PHYSICAL EXAM:  VITAL SIGNS: ED Triage Vitals  Enc Vitals Group     BP 05/16/21 1921 (!) 143/71     Pulse Rate 05/16/21 1921 (!) 107     Resp 05/16/21 1921 17     Temp 05/16/21 1921 99.8 F (37.7 C)     Temp Source 05/16/21 1921 Oral     SpO2 05/16/21 1921 98 %     Weight --      Height --      Head Circumference --      Peak Flow --      Pain Score 05/16/21 1920 5     Pain Loc --      Pain Edu? --      Excl. in GC? --      Constitutional: Alert and oriented. Well appearing  and in no acute distress. Eyes: Conjunctivae are normal. PERRL. EOMI. Head: Atraumatic. ENT:      Nose: No congestion/rhinnorhea.      Mouth/Throat: Mucous membranes are moist.  Posterior pharynx is erythematous with tonsillar exudate. Neck: No stridor.  No cervical spine tenderness to palpation. Hematological/Lymphatic/Immunilogical: Palpable cervical lymphadenopathy. Cardiovascular: Normal rate, regular rhythm. Normal S1 and S2.  Good peripheral circulation. Respiratory: Normal respiratory effort without tachypnea or retractions. Lungs CTAB. Good air entry to the bases with no decreased or absent breath sounds Gastrointestinal: Bowel sounds x 4 quadrants. Soft and nontender to palpation. No guarding or rigidity. No distention. Musculoskeletal: Full range  of motion to all extremities. No obvious deformities noted Neurologic:  Normal for age. No gross focal neurologic deficits are appreciated.  Skin:  Skin is warm, dry and intact. No rash noted. Psychiatric: Mood and affect are normal for age. Speech and behavior are normal.   ____________________________________________   LABS (all labs ordered are listed, but only abnormal results are displayed)  Labs Reviewed  POCT RAPID STREP A, ED / UC - Abnormal; Notable for the following components:      Result Value   Streptococcus, Group A Screen (Direct) POSITIVE (*)    All other components within normal limits  SARS CORONAVIRUS 2 (TAT 6-24 HRS)   ____________________________________________  EKG   ____________________________________________  RADIOLOGY  No results found.  ____________________________________________    PROCEDURES  Procedure(s) performed:     Procedures     Medications - No data to display   ____________________________________________   INITIAL IMPRESSION / ASSESSMENT AND PLAN / ED COURSE  Pertinent labs & imaging results that were available during my care of the patient were reviewed by me and considered in my medical decision making (see chart for details).      Assessment and plan Pharyngitis 35 year old female presents to the urgent care with pharyngitis and low-grade fever for the past 3 days.  Patient was tachycardic at triage but vital signs otherwise reassuring.  She tested positive for group A strep in the urgent care.  We will treat with amoxicillin twice daily for the next 10 days.     ____________________________________________  FINAL CLINICAL IMPRESSION(S) / ED DIAGNOSES  Final diagnoses:  Acute pharyngitis, unspecified etiology      NEW MEDICATIONS STARTED DURING THIS VISIT:  ED Discharge Orders         Ordered    amoxicillin (AMOXIL) 875 MG tablet  2 times daily        05/16/21 2005              This  chart was dictated using voice recognition software/Dragon. Despite best efforts to proofread, errors can occur which can change the meaning. Any change was purely unintentional.     Orvil Feil, PA-C 05/16/21 2010

## 2021-05-17 LAB — SARS CORONAVIRUS 2 (TAT 6-24 HRS): SARS Coronavirus 2: NEGATIVE

## 2022-06-21 ENCOUNTER — Emergency Department (HOSPITAL_COMMUNITY)
Admission: EM | Admit: 2022-06-21 | Discharge: 2022-06-21 | Disposition: A | Discharge status: Home / Self Care | Payer: Medicaid Other | Attending: Emergency Medicine | Admitting: Emergency Medicine

## 2022-06-21 ENCOUNTER — Ambulatory Visit (HOSPITAL_COMMUNITY)
Admission: EM | Admit: 2022-06-21 | Discharge: 2022-06-21 | Disposition: A | Discharge status: Home / Self Care | Payer: Medicaid Other | Attending: Internal Medicine | Admitting: Internal Medicine

## 2022-06-21 ENCOUNTER — Encounter (HOSPITAL_COMMUNITY): Payer: Self-pay

## 2022-06-21 ENCOUNTER — Emergency Department (HOSPITAL_COMMUNITY): Payer: Medicaid Other

## 2022-06-21 ENCOUNTER — Other Ambulatory Visit: Payer: Self-pay

## 2022-06-21 DIAGNOSIS — I10 Essential (primary) hypertension: Secondary | ICD-10-CM

## 2022-06-21 DIAGNOSIS — R079 Chest pain, unspecified: Secondary | ICD-10-CM

## 2022-06-21 DIAGNOSIS — Z79899 Other long term (current) drug therapy: Secondary | ICD-10-CM | POA: Insufficient documentation

## 2022-06-21 LAB — CBC
HCT: 45.9 % (ref 36.0–46.0)
Hemoglobin: 15.5 g/dL — ABNORMAL HIGH (ref 12.0–15.0)
MCH: 30 pg (ref 26.0–34.0)
MCHC: 33.8 g/dL (ref 30.0–36.0)
MCV: 89 fL (ref 80.0–100.0)
Platelets: 344 10*3/uL (ref 150–400)
RBC: 5.16 MIL/uL — ABNORMAL HIGH (ref 3.87–5.11)
RDW: 13.5 % (ref 11.5–15.5)
WBC: 11.1 10*3/uL — ABNORMAL HIGH (ref 4.0–10.5)
nRBC: 0 % (ref 0.0–0.2)

## 2022-06-21 LAB — TROPONIN I (HIGH SENSITIVITY)
Troponin I (High Sensitivity): 6 ng/L (ref <18)
Troponin I (High Sensitivity): 5 ng/L (ref <18)

## 2022-06-21 LAB — BASIC METABOLIC PANEL
Anion gap: 9 (ref 5–15)
BUN: 9 mg/dL (ref 6–20)
CO2: 22 mmol/L (ref 22–32)
Calcium: 8.9 mg/dL (ref 8.9–10.3)
Chloride: 108 mmol/L (ref 98–111)
Creatinine, Ser: 0.62 mg/dL (ref 0.44–1.00)
GFR, Estimated: >60 mL/min (ref >60)
Glucose, Bld: 134 mg/dL — ABNORMAL HIGH (ref 70–99)
Potassium: 3.4 mmol/L — ABNORMAL LOW (ref 3.5–5.1)
Sodium: 139 mmol/L (ref 135–145)

## 2022-06-21 LAB — I-STAT BETA HCG BLOOD, ED (MC, WL, AP ONLY): I-stat hCG, quantitative: <5 m[IU]/mL (ref <5)

## 2022-06-21 MED ORDER — HYDROCHLOROTHIAZIDE 25 MG PO TABS: 25.0000 mg | ORAL_TABLET | Freq: Every day | ORAL | Status: DC

## 2022-06-21 MED ORDER — HYDROCHLOROTHIAZIDE 25 MG PO TABS: 25.0000 mg | ORAL_TABLET | Freq: Every day | ORAL | 0 refills | Status: DC

## 2022-06-21 MED ORDER — HYDROCHLOROTHIAZIDE 25 MG PO TABS
25.0000 mg | ORAL_TABLET | Freq: Every day | ORAL | Status: DC
  Administered 2022-06-21: 25 mg via ORAL
  Filled 2022-06-21: qty 1

## 2022-06-21 MED ORDER — LISINOPRIL 10 MG PO TABS
10.0000 mg | ORAL_TABLET | Freq: Once | ORAL | Status: AC
  Administered 2022-06-21: 10 mg via ORAL
  Filled 2022-06-21: qty 1

## 2022-06-21 MED ORDER — LISINOPRIL 10 MG PO TABS: 10.0000 mg | ORAL_TABLET | Freq: Every day | ORAL | 0 refills | Status: DC

## 2022-06-21 NOTE — ED Provider Triage Note (Signed)
Emergency Medicine Provider Triage Evaluation Note  Suzanne Casey , a 36 y.o. female  was evaluated in triage.  Pt complains of left-sided chest pain for the last 3 to 4 days.  Patient states it is somewhat worse with movement.  It has been constant since onset.  She was seen evaluated urgent care and sent here to rule out cardiac ischemia.  Patient denies nausea, vomiting, diarrhea, diaphoresis.  Review of Systems  Positive:  Negative: See above   Physical Exam  BP (!) 161/104 (BP Location: Right Arm)   Pulse (!) 104   Temp 98.1 F (36.7 C) (Oral)   Resp 18   Ht 5\' 7"  (1.702 m)   Wt 111.1 kg   LMP 05/23/2022 (Approximate)   SpO2 99%   BMI 38.36 kg/m  Gen:   Awake, no distress   Resp:  Normal effort  MSK:   Moves extremities without difficulty  Other:    Medical Decision Making  Medically screening exam initiated at 2:17 PM.  Appropriate orders placed.  Suzanne Casey was informed that the remainder of the evaluation will be completed by another provider, this initial triage assessment does not replace that evaluation, and the importance of remaining in the ED until their evaluation is complete.     Suzanne Casey Suzanne Casey, [place] 06/21/22 1419

## 2024-08-23 ENCOUNTER — Ambulatory Visit (HOSPITAL_COMMUNITY)
Admission: EM | Admit: 2024-08-23 | Discharge: 2024-08-23 | Disposition: A | Discharge status: Home / Self Care | Attending: Family Medicine | Admitting: Family Medicine

## 2024-08-23 ENCOUNTER — Encounter (HOSPITAL_COMMUNITY): Payer: Self-pay | Admitting: *Deleted

## 2024-08-23 DIAGNOSIS — T23221A Burn of second degree of single right finger (nail) except thumb, initial encounter: Secondary | ICD-10-CM

## 2024-08-23 MED ORDER — LISINOPRIL 10 MG PO TABS: 10.0000 mg | ORAL_TABLET | Freq: Every day | ORAL | 2 refills | Status: DC

## 2024-08-23 MED ORDER — HYDROCHLOROTHIAZIDE 25 MG PO TABS
25.0000 mg | ORAL_TABLET | Freq: Every day | ORAL | 2 refills | Status: DC
Start: 2024-08-23 — End: 2024-10-13

## 2024-08-23 MED ORDER — KETOROLAC TROMETHAMINE 30 MG/ML IJ SOLN
INTRAMUSCULAR | Status: AC
  Filled 2024-08-23: qty 1

## 2024-08-23 MED ORDER — CEPHALEXIN 500 MG PO CAPS
500.0000 mg | ORAL_CAPSULE | Freq: Two times a day (BID) | ORAL | 0 refills | Status: AC
Start: 2024-08-23 — End: 2024-08-30

## 2024-08-23 MED ORDER — KETOROLAC TROMETHAMINE 10 MG PO TABS: 10.0000 mg | ORAL_TABLET | Freq: Four times a day (QID) | ORAL | 0 refills | Status: DC | PRN

## 2024-08-23 MED ORDER — KETOROLAC TROMETHAMINE 30 MG/ML IJ SOLN
30.0000 mg | Freq: Once | INTRAMUSCULAR | Status: AC
  Administered 2024-08-23: 30 mg via INTRAMUSCULAR
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
## 2024-08-23 NOTE — ED Provider Notes (Signed)
 [facility]    CSN: [id number] Arrival date & time: 08/23/24  1610      History   Chief Complaint Chief Complaint  Patient presents with   Burn    HPI Suzanne Casey is a 38 y.o. female.    Burn Here for burns on her right middle finger. Happened yesterday when she was taking the hot radiator cap off her car.  Is a little more swollen today. A blister coalesced on the dorsum of her finger.   NKDA  Has an IUD.   Also, has htn, and has been out of her meds for a while. Has mcd now, and wants to see if I can do some rx for her meds. Past Medical History:  Diagnosis Date   Hypertension     There are no active problems to display for this patient.   Past Surgical History:  Procedure Laterality Date   APPENDECTOMY     CHOLECYSTECTOMY      OB History   No obstetric history on file.      Home Medications    Prior to Admission medications   Medication Sig Start Date End Date Taking? Authorizing Provider  cephALEXin  (KEFLEX ) 500 MG capsule Take 1 capsule (500 mg total) by mouth 2 (two) times daily for 7 days. 08/23/24 08/30/24 Yes Vonna Sharlet POUR, MD  ketorolac  (TORADOL ) 10 MG tablet Take 1 tablet (10 mg total) by mouth every 6 (six) hours as needed (pain). 08/23/24  Yes Vonna Sharlet POUR, MD  acetaminophen  (TYLENOL ) 325 MG tablet Take 650 mg by mouth daily as needed for pain or moderate pain.    [provider]  hydrochlorothiazide  (HYDRODIURIL ) 25 MG tablet Take 1 tablet (25 mg total) by mouth daily. 08/23/24 09/22/24  Vonna Sharlet POUR, MD  lisinopril  (ZESTRIL ) 10 MG tablet Take 1 tablet (10 mg total) by mouth daily. 08/23/24 09/22/24  Vonna Sharlet POUR, MD    Family History History reviewed. No pertinent family history.  Social History Social History   Tobacco Use   Smoking status: Every Day    Current packs/day: 0.50    Types: Cigarettes   Smokeless tobacco: Never  Vaping Use   Vaping status: Never Used  Substance Use Topics    Alcohol use: Yes    Comment: rarely   Drug use: Never     Allergies   Patient has no known allergies.   Review of Systems Review of Systems   Physical Exam Triage Vital Signs ED Triage Vitals  Encounter Vitals Group     BP 08/23/24 1702 (!) 181/109     Girls Systolic BP Percentile --      Girls Diastolic BP Percentile --      Boys Systolic BP Percentile --      Boys Diastolic BP Percentile --      Pulse Rate 08/23/24 1702 (!) 109     Resp 08/23/24 1702 18     Temp 08/23/24 1702 (!) 97.5 F (36.4 C)     Temp Source 08/23/24 1702 Oral     SpO2 08/23/24 1702 96 %     Weight --      Height --      Head Circumference --      Peak Flow --      Pain Score 08/23/24 1706 5     Pain Loc --      Pain Education --      Exclude from Growth Chart --    No data found.  Updated Vital Signs BP (!) 181/109 Comment: has been off HTN meds x approx 2 months  Pulse (!) 109   Temp (!) 97.5 F (36.4 C) (Oral)   Resp 18   SpO2 96%   Visual Acuity Right Eye Distance:   Left Eye Distance:   Bilateral Distance:    Right Eye Near:   Left Eye Near:    Bilateral Near:     Physical Exam Vitals reviewed.  Constitutional:      General: She is not in acute distress.    Appearance: She is not ill-appearing, toxic-appearing or diaphoretic.  Skin:    Coloration: Skin is not pale.     Comments: There is some swelling and tenderness of the distal middle finger pad of the right hand, with some erythema of the finger pad. Blisters are evident on the medial surface of the finger extending onto the finger pad, and a small one is evident prox to nailfold. Blisters are intact  Neurological:     Mental Status: She is alert and oriented to person, place, and time.  Psychiatric:        Behavior: Behavior normal.      UC Treatments / Results  Labs (all labs ordered are listed, but only abnormal results are displayed) Labs Reviewed - No data to display  EKG   Radiology No results  found.  Procedures Procedures (including critical care time)  Medications Ordered in UC Medications  ketorolac  (TORADOL ) 30 MG/ML injection 30 mg (has no administration in time range)    Initial Impression / Assessment and Plan / UC Course  I have reviewed the triage vital signs and the nursing notes.  Pertinent labs & imaging results that were available during my care of the patient were reviewed by me and considered in my medical decision making (see chart for details).     I have left the blisters intact. Keflex  sent in for poss secondary infection. Toradol  injection done here for the pain, and tabs sent to the pharmacy.  BP here is 181/109. Lisinopril  and hydrochlorothiazide  sent for that. She will make a f/u appt with her primary.  Finger guard is applied. Final Clinical Impressions(s) / UC Diagnoses   Final diagnoses:  Partial thickness burn of finger of right hand, initial encounter     Discharge Instructions      You have been given a shot of Toradol  30 mg today.  Cephalexin  500 mg --1 tablet by mouth 2 times daily for 7 days.  Restart the hydrochlorothiazide  and lisinopril  for your bp.  You can apply triple antibiotic ointment to the burns once the blisters pop.  Ice and elevate that hand today and tomorrow.     ED Prescriptions     Medication Sig Dispense Auth. Provider   hydrochlorothiazide  (HYDRODIURIL ) 25 MG tablet Take 1 tablet (25 mg total) by mouth daily. 30 tablet Vonna Sharlet POUR, MD   lisinopril  (ZESTRIL ) 10 MG tablet Take 1 tablet (10 mg total) by mouth daily. 30 tablet Jahmani Staup, Sharlet POUR, MD   cephALEXin  (KEFLEX ) 500 MG capsule Take 1 capsule (500 mg total) by mouth 2 (two) times daily for 7 days. 14 capsule Karey Suthers K, MD   ketorolac  (TORADOL ) 10 MG tablet Take 1 tablet (10 mg total) by mouth every 6 (six) hours as needed (pain). 20 tablet Arraya Buck, Sharlet POUR, MD      I have reviewed the PDMP during this encounter.   Vonna Sharlet POUR, MD 08/23/24 [phone]

## 2024-08-23 NOTE — ED Triage Notes (Addendum)
 Pt states she touched the hot cap to her radiator yesterday, sustaining a burn to right middle finger; Today has large blisters to distal aspect of right middle finger, along with redness and swelling. CMS intact to right middle finger. Has been using moleskin, applying Bactine, and applied Neosporin. Also reports a few small superficial burns to right arm with slight redness.

## 2024-10-13 ENCOUNTER — Ambulatory Visit: Admitting: Family Medicine

## 2024-10-13 ENCOUNTER — Encounter: Payer: Self-pay | Admitting: Family Medicine

## 2024-10-13 VITALS — BP 158/100 | HR 94 | Temp 97.9°F | Ht 67.0 in | Wt 248.0 lb

## 2024-10-13 DIAGNOSIS — E66812 Obesity, class 2: Secondary | ICD-10-CM | POA: Diagnosis not present

## 2024-10-13 DIAGNOSIS — D229 Melanocytic nevi, unspecified: Secondary | ICD-10-CM

## 2024-10-13 DIAGNOSIS — Z7689 Persons encountering health services in other specified circumstances: Secondary | ICD-10-CM

## 2024-10-13 DIAGNOSIS — F419 Anxiety disorder, unspecified: Secondary | ICD-10-CM | POA: Insufficient documentation

## 2024-10-13 DIAGNOSIS — F32A Depression, unspecified: Secondary | ICD-10-CM | POA: Insufficient documentation

## 2024-10-13 DIAGNOSIS — Z6838 Body mass index (BMI) 38.0-38.9, adult: Secondary | ICD-10-CM

## 2024-10-13 DIAGNOSIS — R233 Spontaneous ecchymoses: Secondary | ICD-10-CM

## 2024-10-13 DIAGNOSIS — Z803 Family history of malignant neoplasm of breast: Secondary | ICD-10-CM | POA: Diagnosis not present

## 2024-10-13 DIAGNOSIS — I10 Essential (primary) hypertension: Secondary | ICD-10-CM | POA: Diagnosis not present

## 2024-10-13 DIAGNOSIS — Z1231 Encounter for screening mammogram for malignant neoplasm of breast: Secondary | ICD-10-CM

## 2024-10-13 DIAGNOSIS — K219 Gastro-esophageal reflux disease without esophagitis: Secondary | ICD-10-CM | POA: Insufficient documentation

## 2024-10-13 DIAGNOSIS — Z124 Encounter for screening for malignant neoplasm of cervix: Secondary | ICD-10-CM

## 2024-10-13 DIAGNOSIS — Z1159 Encounter for screening for other viral diseases: Secondary | ICD-10-CM

## 2024-10-13 LAB — CBC WITH DIFFERENTIAL/PLATELET
Basophils Absolute: 0.1 K/uL (ref 0.0–0.1)
Basophils Relative: 1 % (ref 0.0–3.0)
Eosinophils Absolute: 0.1 K/uL (ref 0.0–0.7)
Eosinophils Relative: 1.4 % (ref 0.0–5.0)
HCT: 45 % (ref 36.0–46.0)
Hemoglobin: 15 g/dL (ref 12.0–15.0)
Lymphocytes Relative: 30.2 % (ref 12.0–46.0)
Lymphs Abs: 2.2 K/uL (ref 0.7–4.0)
MCHC: 33.3 g/dL (ref 30.0–36.0)
MCV: 89.7 fl (ref 78.0–100.0)
Monocytes Absolute: 0.5 K/uL (ref 0.1–1.0)
Monocytes Relative: 7.1 % (ref 3.0–12.0)
Neutro Abs: 4.5 K/uL (ref 1.4–7.7)
Neutrophils Relative %: 60.3 % (ref 43.0–77.0)
Platelets: 392 K/uL (ref 150.0–400.0)
RBC: 5.02 Mil/uL (ref 3.87–5.11)
RDW: 14.5 % (ref 11.5–15.5)
WBC: 7.5 K/uL (ref 4.0–10.5)

## 2024-10-13 LAB — COMPREHENSIVE METABOLIC PANEL WITH GFR
ALT: 16 U/L (ref 0–35)
AST: 17 U/L (ref 0–37)
Albumin: 4.3 g/dL (ref 3.5–5.2)
Alkaline Phosphatase: 69 U/L (ref 39–117)
BUN: 11 mg/dL (ref 6–23)
CO2: 26 meq/L (ref 19–32)
Calcium: 9.1 mg/dL (ref 8.4–10.5)
Chloride: 103 meq/L (ref 96–112)
Creatinine, Ser: 0.66 mg/dL (ref 0.40–1.20)
GFR: 112.78 mL/min
Glucose, Bld: 85 mg/dL (ref 70–99)
Potassium: 3.6 meq/L (ref 3.5–5.1)
Sodium: 138 meq/L (ref 135–145)
Total Bilirubin: 0.3 mg/dL (ref 0.2–1.2)
Total Protein: 7.4 g/dL (ref 6.0–8.3)

## 2024-10-13 LAB — HEMOGLOBIN A1C: Hgb A1c MFr Bld: 6.1 % (ref 4.6–6.5)

## 2024-10-13 LAB — LIPID PANEL
Cholesterol: 190 mg/dL (ref 0–200)
HDL: 39.1 mg/dL
LDL Cholesterol: 131 mg/dL — ABNORMAL HIGH (ref 0–99)
NonHDL: 150.83
Total CHOL/HDL Ratio: 5
Triglycerides: 101 mg/dL (ref 0.0–149.0)
VLDL: 20.2 mg/dL (ref 0.0–40.0)

## 2024-10-13 LAB — TSH: TSH: 0.47 u[IU]/mL (ref 0.35–5.50)

## 2024-10-13 MED ORDER — HYDROCHLOROTHIAZIDE 25 MG PO TABS: 25.0000 mg | ORAL_TABLET | Freq: Every day | ORAL | 1 refills | Status: DC

## 2024-10-13 MED ORDER — VALSARTAN 80 MG PO TABS: 80.0000 mg | ORAL_TABLET | Freq: Every day | ORAL | 0 refills | Status: DC

## 2024-10-13 NOTE — Patient Instructions (Addendum)
-  It was nice to meet you and look forward to taking care of you. -Placed a referral to GYN for cervical cancer screening. Please call the office or send a MyChart message if you do not receive a phone call or a MyChart message about appointment in 2 weeks.  -Ordered mammogram with family history of breast cancer. Please call the office or send a MyChart message if you do not receive a phone call or a MyChart message about appointment in 2 weeks.  -Placed a referral to dermatology for large mole with bleeding. Please call the office or send a MyChart message if you do not receive a phone call or a MyChart message about appointment in 2 weeks.  -Blood pressure is elevated. Continue to take Hydrochlorothiazide  25mg  daily in the morning. Start taking Valsartan 80mg  daily. Recommend to monitor blood pressure with an upper arm cuff twice a day. Record readings and bring to next appointment.  -Ordered labs (CBC, CMP, Lipid-fasting, TSH, and A1c) based on hypertension and BMI-obesity.  -Follow up in 2 weeks.

## 2024-10-13 NOTE — Assessment & Plan Note (Signed)
-  Blood pressure is elevated. Continue to take Hydrochlorothiazide  25mg  daily in the morning. Start taking Valsartan 80mg  daily. Recommend to monitor blood pressure with an upper arm cuff twice a day. Record readings and bring to next appointment.  -Ordered CMP to assess kidney function and potassium levels.

## 2024-10-13 NOTE — Progress Notes (Signed)
 New Patient Office Visit  Subjective   Patient ID: Suzanne Casey, female    DOB: [birth year]  Age: 38 y.o. MRN: [id number]  CC:  Chief Complaint  Patient presents with   Establish Care   Hypertension    HPI Suzanne Casey presents to establish care with new provider.  Patients previous primary care was [facility] with Aldona Pizza, NP. Last seen in 2019.   Specialist: None   HTN: Chronic. Patient is taking HCTZ 25 daily for the last month. She reports years ago she took this medication and Lisinopril  10mg . Received most recent dose from an urgent care visit. She reports she was monitoring her BP until her BP machine stop working. Denies CP, SHOB, HA, and lightheadedness. Rare dizziness. Some lower extremity edema.   Patient reports she has a large mole on the back of her leg that she needs removed. Reports it has been bleeding.   Outpatient Encounter Medications as of 10/13/2024  Medication Sig   acetaminophen  (TYLENOL ) 325 MG tablet Take 650 mg by mouth daily as needed for pain or moderate pain.   valsartan (DIOVAN) 80 MG tablet Take 1 tablet (80 mg total) by mouth daily.   [DISCONTINUED] hydrochlorothiazide  (HYDRODIURIL ) 25 MG tablet Take 1 tablet (25 mg total) by mouth daily.   [DISCONTINUED] ketorolac  (TORADOL ) 10 MG tablet Take 1 tablet (10 mg total) by mouth every 6 (six) hours as needed (pain).   hydrochlorothiazide  (HYDRODIURIL ) 25 MG tablet Take 1 tablet (25 mg total) by mouth daily.   [DISCONTINUED] lisinopril  (ZESTRIL ) 10 MG tablet Take 1 tablet (10 mg total) by mouth daily.   No facility-administered encounter medications on file as of 10/13/2024.    Past Medical History:  Diagnosis Date   Anxiety    Depression    GERD (gastroesophageal reflux disease)    Hypertension     Past Surgical History:  Procedure Laterality Date   APPENDECTOMY     CHOLECYSTECTOMY      Family History  Problem Relation Age of Onset   Cancer Mother         Breast-Bone   Depression Mother    Hearing loss Mother    Hyperlipidemia Mother    Stroke Mother    Healthy Father    Alcohol abuse Brother    COPD Maternal Grandmother        Breast   Heart attack Maternal Grandmother    Stroke Maternal Grandmother    Heart attack Maternal Grandfather    Stroke Maternal Grandfather     Social History   Socioeconomic History   Marital status: Single    Spouse name: Not on file   Number of children: 1   Years of education: Not on file   Highest education level: High school graduate  Occupational History   Not on file  Tobacco Use   Smoking status: Every Day    Current packs/day: 0.50    Types: Cigarettes   Smokeless tobacco: Never  Vaping Use   Vaping status: Never Used  Substance and Sexual Activity   Alcohol use: Yes    Comment: rarely   Drug use: Never   Sexual activity: Yes    Birth control/protection: I.U.D.  Other Topics Concern   Not on file  Social History Narrative   Not on file   Social Drivers of Health   Financial Resource Strain: Low Risk  (10/13/2024)   Overall Financial Resource Strain (CARDIA)    Difficulty of Paying Living Expenses: Not  hard at all  Food Insecurity: No Food Insecurity (10/13/2024)   Hunger Vital Sign    Worried About Running Out of Food in the Last Year: Never true    Ran Out of Food in the Last Year: Never true  Transportation Needs: No Transportation Needs (10/13/2024)   PRAPARE - Administrator, Civil Service (Medical): No    Lack of Transportation (Non-Medical): No  Physical Activity: Insufficiently Active (10/13/2024)   Exercise Vital Sign    Days of Exercise per Week: 4 days    Minutes of Exercise per Session: 30 min  Stress: Stress Concern Present (10/13/2024)   [employer] of Occupational Health - Occupational Stress Questionnaire    Feeling of Stress: To some extent  Social Connections: Socially Isolated (10/13/2024)   Social Connection and Isolation Panel     Frequency of Communication with Friends and Family: More than three times a week    Frequency of Social Gatherings with Friends and Family: Twice a week    Attends Religious Services: Never    Database administrator or Organizations: No    Attends Banker Meetings: Never    Marital Status: Never married  Intimate Partner Violence: Not At Risk (10/13/2024)   Humiliation, Afraid, Rape, and Kick questionnaire    Fear of Current or Ex-Partner: No    Emotionally Abused: No    Physically Abused: No    Sexually Abused: No    ROS See HPI above    Objective  BP (!) 158/100   Pulse 94   Temp 97.9 F (36.6 C) (Oral)   Ht 5' 7 (1.702 m)   Wt 248 lb (112.5 kg)   SpO2 98%   BMI 38.84 kg/m   Physical Exam Vitals reviewed.  Constitutional:      General: She is not in acute distress.    Appearance: Normal appearance. She is not ill-appearing, toxic-appearing or diaphoretic.  HENT:     Head: Normocephalic and atraumatic.  Eyes:     General:        Right eye: No discharge.        Left eye: No discharge.     Conjunctiva/sclera: Conjunctivae normal.  Cardiovascular:     Rate and Rhythm: Normal rate.  Pulmonary:     Effort: Pulmonary effort is normal. No respiratory distress.  Musculoskeletal:        General: Normal range of motion.  Skin:    General: Skin is warm and dry.     Comments: Large mole on posterior right upper leg.   Neurological:     General: No focal deficit present.     Mental Status: She is alert and oriented to person, place, and time. Mental status is at baseline.  Psychiatric:        Mood and Affect: Mood normal.        Behavior: Behavior normal.        Thought Content: Thought content normal.        Judgment: Judgment normal.       Assessment & Plan:  Primary hypertension Assessment & Plan: -Blood pressure is elevated. Continue to take Hydrochlorothiazide  25mg  daily in the morning. Start taking Valsartan 80mg  daily. Recommend to monitor  blood pressure with an upper arm cuff twice a day. Record readings and bring to next appointment.  -Ordered CMP to assess kidney function and potassium levels.   Orders: -     Comprehensive metabolic panel with GFR -  Valsartan; Take 1 tablet (80 mg total) by mouth daily.  Dispense: 30 tablet; Refill: 0 -     hydroCHLOROthiazide ; Take 1 tablet (25 mg total) by mouth daily.  Dispense: 90 tablet; Refill: 1  Family history of breast cancer in first degree relative -     3D Screening Mammogram, Left and Right; Future  Enlarged skin mole -     Ambulatory referral to Dermatology  Bleeding skin mole -     Ambulatory referral to Dermatology  Class 2 severe obesity due to excess calories with serious comorbidity and body mass index (BMI) of 38.0 to 38.9 in adult -     CBC with Differential/Platelet -     Comprehensive metabolic panel with GFR -     Hemoglobin A1c -     Lipid panel -     TSH  Need for hepatitis C screening test -     Hepatitis C antibody  Encounter for screening mammogram for malignant neoplasm of breast -     3D Screening Mammogram, Left and Right; Future  Cervical cancer screening -     Ambulatory referral to Gynecology  Encounter to establish care  1.Review health maintenance:  -Cervical Cancer Screening: Referral to GYN  -Covid booster: Declines  -Hep B vaccine: Unknown -Hep C screening: Order -HPV vaccine: Unknown  -Influenza vaccine: Declines -PNA vaccine: Declines  -Mammogram: Order; family history of mother, passing away at age 67, and maternal grandmother 2.Placed a referral to dermatology for large mole with bleeding. Please call the office or send a MyChart message if you do not receive a phone call or a MyChart message about appointment in 2 weeks.  3.Ordered labs (CBC, CMP, Lipid-fasting, TSH, and A1c) based on hypertension and BMI-obesity.  Return in about 2 weeks (around 10/27/2024) for follow-up.   Bane Hagy, NP

## 2024-10-14 ENCOUNTER — Encounter: Payer: Self-pay | Admitting: Family Medicine

## 2024-10-14 LAB — HEPATITIS C ANTIBODY: Hepatitis C Ab: NONREACTIVE

## 2024-10-15 ENCOUNTER — Ambulatory Visit: Payer: Self-pay | Admitting: Family Medicine

## 2024-10-27 ENCOUNTER — Ambulatory Visit (INDEPENDENT_AMBULATORY_CARE_PROVIDER_SITE_OTHER): Admitting: Family Medicine

## 2024-10-27 ENCOUNTER — Ambulatory Visit
Admission: RE | Admit: 2024-10-27 | Discharge: 2024-10-27 | Disposition: A | Discharge status: Home / Self Care | Source: Ambulatory Visit | Attending: Family Medicine | Admitting: Family Medicine

## 2024-10-27 VITALS — BP 150/110 | HR 100 | Temp 97.6°F | Ht 67.0 in | Wt 252.0 lb

## 2024-10-27 DIAGNOSIS — I10 Essential (primary) hypertension: Secondary | ICD-10-CM

## 2024-10-27 DIAGNOSIS — Z803 Family history of malignant neoplasm of breast: Secondary | ICD-10-CM

## 2024-10-27 DIAGNOSIS — Z1231 Encounter for screening mammogram for malignant neoplasm of breast: Secondary | ICD-10-CM

## 2024-10-27 MED ORDER — VALSARTAN 320 MG PO TABS: 320.0000 mg | ORAL_TABLET | Freq: Every day | ORAL | 3 refills | Status: DC

## 2024-10-27 NOTE — Assessment & Plan Note (Signed)
-  Blood pressure is elevated. Continue to take Hydrochlorothiazide  25mg  daily in the morning. Increase Valsartan to 320mg  daily from 80mg  daily. Recommend to continue monitor blood pressure with an upper arm cuff twice a day. Record readings and bring to next appointment. (May take 4-80mg  tablets to equal 320mg  to finish medication already on hand).

## 2024-10-27 NOTE — Patient Instructions (Addendum)
-  It was good to see you today.  -Blood pressure is elevated. Continue to take Hydrochlorothiazide  25mg  daily in the morning. Increase Valsartan to 320mg  daily from 80mg  daily. Recommend to continue monitor blood pressure with an upper arm cuff twice a day. Record readings and bring to next appointment. (May take 4-80mg  tablets to equal 320mg  to finish medication already on hand).  -Provided referral letter to GYN to schedule appointment from initial appointment.  -Discussed about lifestyle changes of diet and exercise to improve prediabetes and elevated LDL (bad) cholesterol.  -Follow up 2 weeks.

## 2024-10-27 NOTE — Progress Notes (Signed)
   Established Patient Office Visit   Subjective:  Patient ID: Suzanne Casey, female    DOB: Jan 03, 1986  Age: 38 y.o. MRN: [id number]  Chief Complaint  Patient presents with   Medical Management of Chronic Issues    2 week follow up     HPI HTN: Chronic. On previous appointment, 10/13/2024, advised to continue to taking Hydrochlorothiazide  25mg  daily in the morning and start taking Valsartan 80mg  daily. She has brought in readings, ranging 135-202/98-129. Denies CP, SHOB, and lightheadedness. Rare dizziness and occasional headaches. Some lower extremity edema.    ROS See HPI above     Objective:   BP (!) 150/110   Pulse 100   Temp 97.6 F (36.4 C) (Oral)   Ht 5' 7 (1.702 m)   Wt 252 lb (114.3 kg)   SpO2 98%   BMI 39.47 kg/m    Physical Exam Vitals reviewed.  Constitutional:      General: She is not in acute distress.    Appearance: Normal appearance. She is obese. She is not ill-appearing, toxic-appearing or diaphoretic.  HENT:     Head: Normocephalic and atraumatic.  Eyes:     General:        Right eye: No discharge.        Left eye: No discharge.     Conjunctiva/sclera: Conjunctivae normal.  Cardiovascular:     Rate and Rhythm: Normal rate and regular rhythm.     Heart sounds: Normal heart sounds. No murmur heard.    No friction rub. No gallop.  Pulmonary:     Effort: Pulmonary effort is normal. No respiratory distress.     Breath sounds: Normal breath sounds.  Musculoskeletal:        General: Normal range of motion.  Skin:    General: Skin is warm and dry.  Neurological:     General: No focal deficit present.     Mental Status: She is alert and oriented to person, place, and time. Mental status is at baseline.  Psychiatric:        Mood and Affect: Mood normal.        Behavior: Behavior normal.        Thought Content: Thought content normal.        Judgment: Judgment normal.      Assessment & Plan:  Primary hypertension Assessment &  Plan: -Blood pressure is elevated. Continue to take Hydrochlorothiazide  25mg  daily in the morning. Increase Valsartan to 320mg  daily from 80mg  daily. Recommend to continue monitor blood pressure with an upper arm cuff twice a day. Record readings and bring to next appointment. (May take 4-80mg  tablets to equal 320mg  to finish medication already on hand).   Orders: -     Valsartan; Take 1 tablet (320 mg total) by mouth daily.  Dispense: 90 tablet; Refill: 3  -Provided referral letter to GYN to schedule appointment from initial appointment.  -Discussed about lifestyle changes of diet and exercise to improve prediabetes and elevated LDL (bad) cholesterol.   Return in about 2 weeks (around 11/10/2024) for follow-up.   Marico Buckle, NP

## 2024-11-10 ENCOUNTER — Encounter: Payer: Self-pay | Admitting: Family Medicine

## 2024-11-10 ENCOUNTER — Ambulatory Visit (INDEPENDENT_AMBULATORY_CARE_PROVIDER_SITE_OTHER): Admitting: Family Medicine

## 2024-11-10 ENCOUNTER — Ambulatory Visit: Payer: Self-pay | Admitting: Family Medicine

## 2024-11-10 VITALS — BP 150/100 | HR 105 | Temp 98.1°F | Ht 67.0 in | Wt 254.0 lb

## 2024-11-10 DIAGNOSIS — R0602 Shortness of breath: Secondary | ICD-10-CM

## 2024-11-10 DIAGNOSIS — R0683 Snoring: Secondary | ICD-10-CM | POA: Diagnosis not present

## 2024-11-10 DIAGNOSIS — E538 Deficiency of other specified B group vitamins: Secondary | ICD-10-CM

## 2024-11-10 DIAGNOSIS — I10 Essential (primary) hypertension: Secondary | ICD-10-CM | POA: Diagnosis not present

## 2024-11-10 DIAGNOSIS — R5383 Other fatigue: Secondary | ICD-10-CM | POA: Diagnosis not present

## 2024-11-10 DIAGNOSIS — R42 Dizziness and giddiness: Secondary | ICD-10-CM | POA: Diagnosis not present

## 2024-11-10 DIAGNOSIS — R9431 Abnormal electrocardiogram [ECG] [EKG]: Secondary | ICD-10-CM

## 2024-11-10 DIAGNOSIS — E559 Vitamin D deficiency, unspecified: Secondary | ICD-10-CM

## 2024-11-10 LAB — VITAMIN D 25 HYDROXY (VIT D DEFICIENCY, FRACTURES): VITD: 13.19 ng/mL — ABNORMAL LOW (ref 30.00–100.00)

## 2024-11-10 LAB — VITAMIN B12: Vitamin B-12: 201 pg/mL — ABNORMAL LOW (ref 211–911)

## 2024-11-10 MED ORDER — AMLODIPINE BESYLATE 5 MG PO TABS: 5.0000 mg | ORAL_TABLET | Freq: Every day | ORAL | 0 refills | Status: DC

## 2024-11-10 NOTE — Assessment & Plan Note (Signed)
-  Blood pressure is still not at goal. Recommend to continue Hydrochlorothiazide  25mg  daily and Valsartan 320mg  daily. Add Amlodipine 5mg  daily. Also, [facility].  -Continue to monitor blood pressure twice a day and bring in readings in 2 weeks.

## 2024-11-10 NOTE — Patient Instructions (Addendum)
-  It was good to see you today.  -Blood pressure is still not at goal. Recommend to continue Hydrochlorothiazide  25mg  daily and Valsartan 320mg  daily. Add Amlodipine 5mg  daily. Also, will refer [facility].  *Continue to monitor blood pressure twice a day and bring in readings in 2 weeks.   -EKG completed today with some concern of your heart being enlarged from the uncontrolled blood pressure.  -Ordered an ultrasound of the heart (Echocardiogram) -Also, ordered ultrasound of your carotids for complaints of dizziness with shortness of breath and having hypertension.  -Orthostatics completed with some change. Recommend to hydrate with water 64-100oz if water a day.    -Fatigue: ordered vitamin B12 and D lab  *Placed an referral to pulmonary for sleep apnea for fatigue and snoring.   Please call the office or send a MyChart message if you do not receive a phone call or a MyChart message about appointment in 2 weeks.  -If you develop chest pain, sustained dizziness or shortness of breath, recommend to call 911 or have someone drive you to the closes emergency department.  -Follow up in 2 weeks for chronic management.

## 2024-11-10 NOTE — Progress Notes (Signed)
 Established Patient Office Visit   Subjective:  Patient ID: Suzanne Casey, female    DOB: December 03, 1986  Age: 38 y.o. MRN: [id number]  Chief Complaint  Patient presents with   Medical Management of Chronic Issues    2 week follow up     HPI HTN: Chronic. Patient is taking HCTZ 25mg  daily and increased Valsartan to 320mg  daily on last visit. Patient has brought in her readings, ranging 114-218/56-137. She denies taking her blood pressure medications this morning. However, yesterday she reports having dizziness about 10 minutes after eating breakfast. Associated symptoms, nausea and vomiting a few hours after dizziness started. She reports the dizziness lasted a few hours. She laid down which seemed to help. Denies CP, but a little SHOB during that time. Also, reports when she was walking through the house yesterday, she felt like she was going to have a syncopal event.   She reports she experiences fatigue on a daily basis. She reports she does snore at night. If given the opportunity, she reports she can easily take a nap on the couch.  ROS See HPI above     Objective:   BP (!) 150/100   Pulse (!) 105   Temp 98.1 F (36.7 C) (Oral)   Ht 5' 7 (1.702 m)   Wt 254 lb (115.2 kg)   SpO2 99%   BMI 39.78 kg/m    Physical Exam Vitals reviewed.  Constitutional:      General: She is not in acute distress.    Appearance: Normal appearance. She is obese. She is not ill-appearing, toxic-appearing or diaphoretic.  HENT:     Head: Normocephalic and atraumatic.  Eyes:     General:        Right eye: No discharge.        Left eye: No discharge.     Conjunctiva/sclera: Conjunctivae normal.  Cardiovascular:     Rate and Rhythm: Normal rate and regular rhythm.     Heart sounds: Normal heart sounds. No murmur heard.    No friction rub. No gallop.  Pulmonary:     Effort: Pulmonary effort is normal. No respiratory distress.     Breath sounds: Normal breath sounds.  Musculoskeletal:         General: Normal range of motion.  Skin:    General: Skin is warm and dry.  Neurological:     General: No focal deficit present.     Mental Status: She is alert and oriented to person, place, and time. Mental status is at baseline.     Cranial Nerves: No facial asymmetry.     Motor: No weakness.     Gait: Gait is intact.  Psychiatric:        Mood and Affect: Mood normal.        Behavior: Behavior normal.        Thought Content: Thought content normal.        Judgment: Judgment normal.   EKG completed due to complaints of dizziness and SHOB with having HTN. Sinus tachy with no ST elevation. Changes from previous EKG back in 06/10/2022 with concern of atrial enlargement     Assessment & Plan:  Dizziness -     EKG 12-Lead -     Orthostatic vital signs -     US  Carotid Bilateral; Future -     ECHOCARDIOGRAM COMPLETE; Future  Primary hypertension Assessment & Plan: -Blood pressure is still not at goal. Recommend to continue Hydrochlorothiazide  25mg  daily and Valsartan  320mg  daily. Add Amlodipine 5mg  daily. Also, [facility].  -Continue to monitor blood pressure twice a day and bring in readings in 2 weeks.   Orders: -     EKG 12-Lead -     [facility]er to [facility] ([id number]) -     amLODIPine Besylate; Take 1 tablet (5 mg total) by mouth daily.  Dispense: 90 tablet; Refill: 0 -     US  Carotid Bilateral; Future -     ECHOCARDIOGRAM COMPLETE; Future  Snoring -     Pulmonary Visit  Shortness of breath -     EKG 12-Lead -     US  Carotid Bilateral; Future -     ECHOCARDIOGRAM COMPLETE; Future  Fatigue, unspecified type -     Pulmonary Visit -     VITAMIN D 25 Hydroxy (Vit-D Deficiency, Fractures) -     Vitamin B12  Abnormal EKG -     ECHOCARDIOGRAM COMPLETE; Future  -EKG completed today with some concern of atrial enlargement of chronic uncontrolled HTN.  -Ordered echocardiogram for further testing with concerns of dizziness  with shortness of breath, abnormal EKG findings, and uncontrolled hypertension.  -Ordered ultrasound of carotids for complaints of dizziness with shortness of breath and having hypertension.  -Orthostatics completed with some change. Recommend to hydrate with water 64-100oz if water a day.   -Fatigue: ordered vitamin B12 and D lab  *Placed an referral to pulmonary for sleep apnea for fatigue and snoring.  -Advised to call 911 or have someone drive her to closes emergency department if chest pain develops or sustained dizziness and shortness  of breath occur.  Return in about 2 weeks (around 11/24/2024) for chronic management.   Dashan Chizmar, NP

## 2024-11-11 ENCOUNTER — Encounter: Payer: Self-pay | Admitting: Primary Care

## 2024-11-11 ENCOUNTER — Ambulatory Visit: Admitting: Primary Care

## 2024-11-11 ENCOUNTER — Encounter: Payer: Self-pay | Admitting: Family Medicine

## 2024-11-11 VITALS — BP 132/80 | HR 97 | Temp 97.8°F | Ht 66.0 in | Wt 253.8 lb

## 2024-11-11 DIAGNOSIS — R0683 Snoring: Secondary | ICD-10-CM

## 2024-11-11 MED ORDER — VITAMIN D3 1.25 MG (50000 UT) PO CAPS: 1.2500 mg | ORAL_CAPSULE | ORAL | 0 refills | Status: AC

## 2024-11-11 MED ORDER — CYANOCOBALAMIN 1000 MCG/ML IJ SOLN: 1000.0000 ug | INTRAMUSCULAR | 5 refills | Status: DC

## 2024-11-11 MED ORDER — NEEDLES & SYRINGES MISC: 1.0000 | 0 refills | Status: AC

## 2024-11-11 NOTE — Progress Notes (Signed)
 @Patient  ID: Suzanne Casey Greet, female    DOB: 09-May-1986, 38 y.o.   MRN: [id number]  Chief Complaint  Patient presents with   Consult    Sleep consult-snoring    Referring provider: Billy Philippe SAUNDERS, NP  HPI: 38 year old female, current every day smoker. PMH significant for HTN, GERD, anxiety/depression.   11/11/2024 Discussed the use of AI scribe software for clinical note transcription with the patient, who gave verbal consent to proceed.  History of Present Illness Suzanne Casey is a 38 year old female with hypertension who presents with fatigue and snoring for a sleep consult. She was referred by Dr. Billy for a sleep consult due to fatigue and snoring.  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 She typically falls asleep within 20 minutes, may wake up once during the night, and usually gets out of bed between 6 and 7 AM. She feels tired constantly, nodding off easily when sitting quietly or reading, but does not experience sudden sleep attacks. Her Epworth Sleepiness Scale score is 16.  She has a history of hypertension, currently managed with hydrochlorothiazide , valvulamide, and a recently added amlodipine. Her blood pressure is not yet at goal, and she has been referred to a hypertension clinic.  She has a history of fluctuating weight, with a BMI of 40, which places her at higher risk for sleep apnea. She has been experiencing weight fluctuations for some time, with recent changes being only a couple of pounds.  Her vitamin D level is very low at 13, and her B12 level is also low. She has not yet received these prescriptions from the pharmacy.  No cardiac issues, COPD, or use of oxygen. She does not consume alcohol and has no family history of sleep apnea, although her brother has a history of seizures.   No Known Allergies  Immunization History  Administered Date(s) Administered    Fluad Trivalent(High Dose 65+) 01/03/2015   Influenza-Unspecified 10/16/2015   Tdap 03/08/2018    Past Medical History:  Diagnosis Date   Anxiety    Depression    GERD (gastroesophageal reflux disease)    Hypertension     Tobacco History: Social History   Tobacco Use  Smoking Status Every Day   Current packs/day: 0.50   Types: Cigarettes  Smokeless Tobacco Never   Ready to quit: Not Answered Counseling given: Not Answered   Outpatient Medications Prior to Visit  Medication Sig Dispense Refill   acetaminophen  (TYLENOL ) 325 MG tablet Take 650 mg by mouth daily as needed for pain or moderate pain.     amLODipine (NORVASC) 5 MG tablet Take 1 tablet (5 mg total) by mouth daily. 90 tablet 0   hydrochlorothiazide  (HYDRODIURIL ) 25 MG tablet Take 1 tablet (25 mg total) by mouth daily. 90 tablet 1   valsartan (DIOVAN) 320 MG tablet Take 1 tablet (320 mg total) by mouth daily. 90 tablet 3   No facility-administered medications prior to visit.   Review of Systems  Review of Systems  Constitutional:  Positive for fatigue.  Respiratory: Negative.    Psychiatric/Behavioral:  Positive for sleep disturbance.    Physical Exam  BP 132/80 (BP Location: Left Arm, Patient Position: Sitting, Cuff Size: Large)   Pulse 97   Temp 97.8 F (36.6 C) (Oral)   Ht 5' 6 (1.676 m)   Wt 253 lb 12.8 oz (115.1 kg)   SpO2 99%  BMI 40.96 kg/m  Physical Exam Constitutional:      Appearance: Normal appearance. She is well-developed. She is obese.  HENT:     Head: Normocephalic and atraumatic.     Mouth/Throat:     Mouth: Mucous membranes are moist.     Pharynx: Oropharynx is clear.  Eyes:     Pupils: Pupils are equal, round, and reactive to light.  Cardiovascular:     Rate and Rhythm: Normal rate and regular rhythm.     Heart sounds: Normal heart sounds. No murmur heard. Pulmonary:     Effort: Pulmonary effort is normal. No respiratory distress.     Breath sounds: Normal breath  sounds. No wheezing or rhonchi.     Comments: CTA Musculoskeletal:        General: Normal range of motion.     Cervical back: Normal range of motion and neck supple.  Skin:    General: Skin is warm and dry.     Findings: No erythema or rash.  Neurological:     General: No focal deficit present.     Mental Status: She is alert and oriented to person, place, and time. Mental status is at baseline.  Psychiatric:        Mood and Affect: Mood normal.        Behavior: Behavior normal.        Thought Content: Thought content normal.        Judgment: Judgment normal.     Lab Results:  CBC    Component Value Date/Time   WBC 7.5 10/13/2024 0950   RBC 5.02 10/13/2024 0950   HGB 15.0 10/13/2024 0950   HCT 45.0 10/13/2024 0950   PLT 392.0 10/13/2024 0950   MCV 89.7 10/13/2024 0950   MCH 30.0 06/21/2022 1421   MCHC 33.3 10/13/2024 0950   RDW 14.5 10/13/2024 0950   LYMPHSABS 2.2 10/13/2024 0950   MONOABS 0.5 10/13/2024 0950   EOSABS 0.1 10/13/2024 0950   BASOSABS 0.1 10/13/2024 0950    BMET    Component Value Date/Time   NA 138 10/13/2024 0950   K 3.6 10/13/2024 0950   CL 103 10/13/2024 0950   CO2 26 10/13/2024 0950   GLUCOSE 85 10/13/2024 0950   BUN 11 10/13/2024 0950   CREATININE 0.66 10/13/2024 0950   CALCIUM 9.1 10/13/2024 0950   GFRNONAA >60 06/21/2022 1421   GFRAA >60 12/15/2018 0347    BNP No results found for: BNP  ProBNP No results found for: PROBNP  Imaging: MM 3D SCREENING MAMMOGRAM BILATERAL BREAST Result Date: 10/31/2024 CLINICAL DATA:  Screening. EXAM: DIGITAL SCREENING BILATERAL MAMMOGRAM WITH TOMOSYNTHESIS AND CAD TECHNIQUE: Bilateral screening digital craniocaudal and mediolateral oblique mammograms were obtained. Bilateral screening digital breast tomosynthesis was performed. The images were evaluated with computer-aided detection. COMPARISON:  None available. ACR Breast Density Category b: There are scattered areas of fibroglandular density.  FINDINGS: There are no findings suspicious for malignancy. IMPRESSION: No mammographic evidence of malignancy. A result letter of this screening mammogram will be mailed directly to the patient. RECOMMENDATION: Screening mammogram at age 39. (Code:SM-B-40A) BI-RADS CATEGORY  1: Negative. Electronically Signed   By: Dina  Arceo M.D.   On: 10/31/2024 14:32     Assessment & Plan:   Assessment and Plan Assessment & Plan Suspected obstructive sleep apnea High Epworth Sleepiness Scale score of 16, snoring reported by daughter, and fatigue. BMI of 40 increases risk for sleep apnea. No history of seizures, cardiac issues, or COPD. No alcohol use.  Home sleep study is appropriate due to absence of contraindications for in-lab monitoring. - Ordered home sleep study through [facility]. - Advised to avoid driving if tired. - Discussed potential treatment options if sleep apnea is confirmed, including weight loss, CPAP, oral appliance, and surgical options like Inspire. - Explained risks of untreated sleep apnea, including cardiac arrhythmia, stroke, and diminished quality of life. - Discussed potential use of Zepbound for moderate or severe sleep apnea with BMI over 40, pending insurance coverage.  Obesity (BMI 40) BMI of 40 classifies as obesity, increasing risk for sleep apnea. Weight fluctuates with recent minor weight loss. Discussed potential use of Zepbound for weight management if sleep apnea is confirmed and BMI remains over 40. - Discussed weight loss as a potential treatment for sleep apnea if confirmed.  Fatigue Potentially related to suspected sleep apnea and vitamin deficiencies. Vitamin D level is low at 13, and B12 is also low. Fatigue could be exacerbated by poor sleep quality due to suspected sleep apnea. - Follow up with primary care provider to ensure vitamin D and B12 prescriptions are sent to pharmacy. - Address vitamin deficiencies as part of fatigue management.  Almarie LELON Ferrari, NP 11/11/2024

## 2024-11-11 NOTE — Patient Instructions (Addendum)
    VISIT SUMMARY: Today, you were seen for a sleep consultation due to fatigue and snoring. You have a history of hypertension and fluctuating weight, and your recent blood tests showed low levels of vitamin D and B12. Your symptoms and high Epworth Sleepiness Scale score suggest that you may have obstructive sleep apnea.  YOUR PLAN: -SUSPECTED OBSTRUCTIVE SLEEP APNEA: Obstructive sleep apnea is a condition where your airway becomes blocked during sleep, causing breathing pauses and poor sleep quality. We have ordered a home sleep study to confirm this diagnosis. You should sleep on your side and avoid sleeping on your stomach due to the equipment. Avoid driving if you feel tired. If sleep apnea is confirmed, treatment options include CPAP, an oral appliance, or surgery. Untreated sleep apnea can lead to serious health issues like heart problems and stroke. We also discussed the potential use of Zepbound for severe sleep apnea if your BMI remains over 40, pending insurance coverage.  -OBESITY (BMI 40): Obesity is a condition where you have an excessive amount of body fat, which increases your risk for various health issues, including sleep apnea. Your BMI of 40 classifies you as obese. We discussed weight loss as a potential treatment for sleep apnea if it is confirmed. The use of Zepbound for weight management was also discussed, pending the confirmation of sleep apnea and insurance coverage.  -FATIGUE: Fatigue is a feeling of constant tiredness or weakness and can be related to various factors, including sleep apnea and vitamin deficiencies. Your vitamin D and B12 levels are low, which can contribute to your fatigue. Follow up with your primary care provider to ensure your vitamin D and B12 prescriptions are sent to the pharmacy. Addressing these deficiencies is part of managing your fatigue.  INSTRUCTIONS: Please complete the home sleep study as ordered and follow the instructions provided. Follow  up with your primary care provider to ensure your vitamin D and B12 prescriptions are sent to the pharmacy. Avoid driving if you feel tired. We will discuss the results of your sleep study and potential treatment options at your next visit.  Follow-up Send mychart message or call 2-3 weeks after completing home sleep test for results / treatment options if needed

## 2024-11-12 MED ORDER — CYANOCOBALAMIN 1000 MCG/ML IJ SOLN: INTRAMUSCULAR | 5 refills | Status: AC

## 2024-11-12 NOTE — Addendum Note (Signed)
 Addended by: Recardo Linn R on: 11/12/2024 07:44 AM   Modules accepted: Orders

## 2024-11-24 ENCOUNTER — Ambulatory Visit (INDEPENDENT_AMBULATORY_CARE_PROVIDER_SITE_OTHER): Admitting: Family Medicine

## 2024-11-24 ENCOUNTER — Encounter: Payer: Self-pay | Admitting: Family Medicine

## 2024-11-24 VITALS — BP 140/73 | HR 97 | Temp 97.7°F | Ht 66.0 in | Wt 254.0 lb

## 2024-11-24 DIAGNOSIS — E559 Vitamin D deficiency, unspecified: Secondary | ICD-10-CM

## 2024-11-24 DIAGNOSIS — R5383 Other fatigue: Secondary | ICD-10-CM

## 2024-11-24 DIAGNOSIS — I10 Essential (primary) hypertension: Secondary | ICD-10-CM

## 2024-11-24 DIAGNOSIS — R42 Dizziness and giddiness: Secondary | ICD-10-CM | POA: Diagnosis not present

## 2024-11-24 DIAGNOSIS — E538 Deficiency of other specified B group vitamins: Secondary | ICD-10-CM | POA: Diagnosis not present

## 2024-11-24 MED ORDER — AMLODIPINE BESYLATE 5 MG PO TABS: 10.0000 mg | ORAL_TABLET | Freq: Every day | ORAL | Status: DC

## 2024-11-24 NOTE — Progress Notes (Signed)
 Established Patient Office Visit   Subjective:  Patient ID: Suzanne Casey, female    DOB: 04-11-1986  Age: 38 y.o. MRN: [id number]  Chief Complaint  Patient presents with   Medical Management of Chronic Issues    2 week follow up     HPI HTN: Chronic. On previous appointment, continued Valsartan  to 320mg  and Hydrochlorothiazide  25mg  and added Amlodipine  5mg  daily.  Blood pressures are ranging 114-176/73-108. BP has been fluctuating some, but overall BP is improving from initial treatment. On previous appointment she was referred to HTN clinic, but has not heard from anyone or on MyChart for an appointment.   On previous appointment, she was complaining of dizziness with associated symptoms of nausea and vomiting, a little SHOB, and feeling like she may have a syncopal episode. Also, complained of fatigue. Headaches are improving. Dizziness is improving. Fatigue is about the same, but just started vitamin D  and B12 supplement from being deficiency. Also, made an appointment with pulmonary and waiting sleep study. Has her echocardiogram on 12/24.  ROS See HPI above     Objective:   BP (!) 140/73   Pulse 97   Temp 97.7 F (36.5 C) (Oral)   Ht 5' 6 (1.676 m)   Wt 254 lb (115.2 kg)   SpO2 98%   BMI 41.00 kg/m  BP Readings from Last 3 Encounters:  11/24/24 (!) 140/73  11/11/24 132/80  11/10/24 (!) 150/100     Physical Exam Vitals reviewed.  Constitutional:      General: She is not in acute distress.    Appearance: Normal appearance. She is not ill-appearing, toxic-appearing or diaphoretic.  HENT:     Head: Normocephalic and atraumatic.  Eyes:     General:        Right eye: No discharge.        Left eye: No discharge.     Conjunctiva/sclera: Conjunctivae normal.  Cardiovascular:     Rate and Rhythm: Normal rate.  Pulmonary:     Effort: Pulmonary effort is normal. No respiratory distress.  Musculoskeletal:        General: Normal range of motion.  Skin:    General:  Skin is warm and dry.  Neurological:     General: No focal deficit present.     Mental Status: She is alert and oriented to person, place, and time. Mental status is at baseline.  Psychiatric:        Mood and Affect: Mood normal.        Behavior: Behavior normal.        Thought Content: Thought content normal.        Judgment: Judgment normal.      Assessment & Plan:  Primary hypertension -     amLODIPine  Besylate; Take 2 tablets (10 mg total) by mouth daily.  Vitamin D  deficiency  Vitamin B12 deficiency  Dizziness  Fatigue, unspecified type  -Blood pressure is still elevated, recommend to continue with Valsartan  320mg  and Hydrochlorothiazide  25mg  daily. Increase Amlodipine  10mg  daily. Recommend to continue to monitor blood pressure twice a day and bring in readings. -Continue with vitamin B12 injections, every 7 days for 4 weeks, then monthly; and Vitamin D  tablet every 7 days for 12 weeks. This should start to help your fatigue some and continue to improve dizziness.  -Provided referral letter to the hypertension clinic to call to schedule an appointment. -Follow up in 2 weeks.   Return in about 2 weeks (around 12/08/2024) for follow-up.  Squire Withey, NP

## 2024-11-24 NOTE — Patient Instructions (Addendum)
-  It was great to see you today. Happy Thanksgiving! -Blood pressure is still elevated, recommend to continue with Valsartan  320mg  and Hydrochlorothiazide  25mg  daily. Increase Amlodipine  10mg  daily. Recommend to continue to monitor blood pressure twice a day and bring in readings. -Continue with vitamin B12 injections, every 7 days for 4 weeks, then monthly; and Vitamin D  tablet every 7 days for 12 weeks. This should start to help your fatigue some and continue to improve dizziness.  -Provided referral letter to the hypertension clinic to call to schedule an appointment. -Follow up in 2 weeks.

## 2024-12-09 ENCOUNTER — Ambulatory Visit (INDEPENDENT_AMBULATORY_CARE_PROVIDER_SITE_OTHER): Admitting: Family Medicine

## 2024-12-09 ENCOUNTER — Encounter: Payer: Self-pay | Admitting: Family Medicine

## 2024-12-09 VITALS — BP 132/80 | HR 83 | Temp 98.4°F | Ht 66.0 in | Wt 261.0 lb

## 2024-12-09 DIAGNOSIS — E559 Vitamin D deficiency, unspecified: Secondary | ICD-10-CM | POA: Diagnosis not present

## 2024-12-09 DIAGNOSIS — I10 Essential (primary) hypertension: Secondary | ICD-10-CM

## 2024-12-09 DIAGNOSIS — E538 Deficiency of other specified B group vitamins: Secondary | ICD-10-CM | POA: Diagnosis not present

## 2024-12-09 MED ORDER — AMLODIPINE BESYLATE 10 MG PO TABS: 10.0000 mg | ORAL_TABLET | Freq: Every day | ORAL | 0 refills | Status: DC

## 2024-12-09 NOTE — Progress Notes (Signed)
° °  Established Patient Office Visit   Subjective:  Patient ID: Suzanne Casey, female    DOB: Dec 13, 1986  Age: 38 y.o. MRN: [id number]  Chief Complaint  Patient presents with   Medical Management of Chronic Issues    2 week follow up hypertension     HPI HTN: Chronic. On previous visit, recommended to continue Valsartan  320mg , HCTZ, 25mg , and increased Amlodipine  to 10mg  daily. Patient has been monitoring her blood pressures at home. Ranging 102-150/68-92 within the last week. Patient reports she is feeling better. She is scheduled for an echocardiogram on 12/24 and has an appointment scheduled with Reche Finder, NP at [facility] at [facility].   She is continuing her vitamin B12 injections for deficiency and vitamin D  weekly supplement for deficiency.  ROS See HPI above     Objective:   BP 132/80   Pulse 83   Temp 98.4 F (36.9 C) (Oral)   Ht 5' 6 (1.676 m)   Wt 261 lb (118.4 kg)   SpO2 98%   BMI 42.13 kg/m  BP Readings from Last 3 Encounters:  12/09/24 132/80  11/24/24 (!) 140/73  11/11/24 132/80      Physical Exam Vitals reviewed.  Constitutional:      General: She is not in acute distress.    Appearance: Normal appearance. She is morbidly obese. She is not ill-appearing, toxic-appearing or diaphoretic.  Eyes:     General:        Right eye: No discharge.        Left eye: No discharge.     Conjunctiva/sclera: Conjunctivae normal.  Cardiovascular:     Rate and Rhythm: Normal rate.  Pulmonary:     Effort: Pulmonary effort is normal. No respiratory distress.  Musculoskeletal:        General: Normal range of motion.  Skin:    General: Skin is warm and dry.  Neurological:     General: No focal deficit present.     Mental Status: She is alert and oriented to person, place, and time. Mental status is at baseline.  Psychiatric:        Mood and Affect: Mood normal.        Behavior: Behavior normal.        Thought Content: Thought content  normal.        Judgment: Judgment normal.      Assessment & Plan:  Primary hypertension Assessment & Plan: Blood pressure is stable today and at goal. Continue Valsartan  320mg , Hydrochlorothiazide  25mg  daily, and Amlodipine  10mg  daily. Refilled Amlodipine  10mg  daily. There was a few elevated readings on her blood pressure form, but thinks it is could be more related to her blood pressure cuff. In the past when she brought her machine in, it was reading a little higher than normal. Discussed about possibly combining these medications into one pill, but prefer for cardiology to evaluate first. Recommended to take her blood pressure readings and blood pressure machine to her cardiology appointment.    Orders: -     amLODIPine  Besylate; Take 1 tablet (10 mg total) by mouth daily.  Dispense: 90 tablet; Refill: 0  Vitamin D  deficiency  Vitamin B12 deficiency  -Continue with Vitamin B12 and vitamin D  supplement for deficiencies. -Encouraged to please call GYN to schedule an appointment. Referral letter was provided at last appointment.   Return in about 9 weeks (around 02/10/2025).   Parvin Stetzer, NP

## 2024-12-09 NOTE — Patient Instructions (Addendum)
-  It was great to see you today. -Blood pressure is stable today and at goal. Continue Valsartan  320mg , Hydrochlorothiazide  25mg  daily, and Amlodipine  10mg  daily. Refilled Amlodipine  10mg  daily. Discussed about possibly combining these medications into one pill, but prefer for cardiology to evaluate first. Recommended to take your blood pressure readings and blood pressure machine to your cardiology appointment.   -Continue with Vitamin B12 and vitamin D  supplement for deficiencies. -Please call GYN to schedule an appointment. Referral letter was provided at last appointment.  -Follow up in 9 weeks, second week in February.

## 2024-12-09 NOTE — Assessment & Plan Note (Signed)
 Blood pressure is stable today and at goal. Continue Valsartan  320mg , Hydrochlorothiazide  25mg  daily, and Amlodipine  10mg  daily. Refilled Amlodipine  10mg  daily. There was a few elevated readings on her blood pressure form, but thinks it is could be more related to her blood pressure cuff. In the past when she brought her machine in, it was reading a little higher than normal. Discussed about possibly combining these medications into one pill, but prefer for cardiology to evaluate first. Recommended to take her blood pressure readings and blood pressure machine to her cardiology appointment.

## 2024-12-13 ENCOUNTER — Encounter

## 2024-12-13 ENCOUNTER — Telehealth: Payer: Self-pay | Admitting: *Deleted

## 2024-12-13 NOTE — Telephone Encounter (Signed)
 Spoke with patient regarding the cancellation of the HST appointment at [address]--office is closed due to maintenance issues--someone will call her tomorrow  12/14/24 to reschedule the HST appointment---she voiced her understanding

## 2024-12-21 ENCOUNTER — Encounter

## 2024-12-21 DIAGNOSIS — R0683 Snoring: Secondary | ICD-10-CM

## 2024-12-22 ENCOUNTER — Ambulatory Visit (HOSPITAL_BASED_OUTPATIENT_CLINIC_OR_DEPARTMENT_OTHER)

## 2024-12-22 ENCOUNTER — Encounter: Payer: Self-pay | Admitting: Family Medicine

## 2024-12-22 DIAGNOSIS — R42 Dizziness and giddiness: Secondary | ICD-10-CM | POA: Diagnosis not present

## 2024-12-22 DIAGNOSIS — I10 Essential (primary) hypertension: Secondary | ICD-10-CM

## 2024-12-22 DIAGNOSIS — R9431 Abnormal electrocardiogram [ECG] [EKG]: Secondary | ICD-10-CM | POA: Diagnosis not present

## 2024-12-22 DIAGNOSIS — R0602 Shortness of breath: Secondary | ICD-10-CM

## 2024-12-22 LAB — ECHOCARDIOGRAM COMPLETE
Area-P 1/2: 4.29 cm2
S' Lateral: 2.31 cm

## 2024-12-27 ENCOUNTER — Other Ambulatory Visit (HOSPITAL_BASED_OUTPATIENT_CLINIC_OR_DEPARTMENT_OTHER): Payer: Self-pay

## 2024-12-27 MED ORDER — BLOOD PRESSURE KIT DEVI: 0 refills | Status: DC

## 2024-12-27 MED ORDER — BLOOD PRESSURE KIT DEVI
0 refills | Status: DC
  Filled 2024-12-27: qty 1, 30d supply, fill #0

## 2024-12-27 NOTE — Addendum Note (Signed)
 Addended by: ELNER NANNY B on: 12/27/2024 03:30 PM   Modules accepted: Orders

## 2025-01-06 ENCOUNTER — Other Ambulatory Visit (HOSPITAL_BASED_OUTPATIENT_CLINIC_OR_DEPARTMENT_OTHER): Payer: Self-pay

## 2025-01-10 ENCOUNTER — Telehealth: Payer: Self-pay | Admitting: Pulmonary Disease

## 2025-01-10 DIAGNOSIS — G479 Sleep disorder, unspecified: Secondary | ICD-10-CM | POA: Diagnosis not present

## 2025-01-10 DIAGNOSIS — R0683 Snoring: Secondary | ICD-10-CM

## 2025-01-10 NOTE — Telephone Encounter (Signed)
 Needs to repeat HST

## 2025-01-10 NOTE — Telephone Encounter (Signed)
 Call patient  Sleep study result  Date of study: 12/22/2024  Impression: Technically limited study due to absent oximetry data, absent flow data for majority of recording While airflow and effort signals suggest mild sleep apnea events, severity cannot be adequately categorized. Repeat study with oximetry is recommended  Recommendation: Recommend to repeat the study to adequately grade the severity  Encourage weight loss measures  Avoid alcohol, sedatives and other CNS depressants that may worsen sleep apnea and disrupt normal sleep architecture. Sleep hygiene should be reviewed to assess factors that may improve sleep quality. Weight management and regular exercise should be initiated or continued  If patient agreeable, repeat home sleep study

## 2025-01-12 NOTE — Telephone Encounter (Signed)
 Patient made aware of results of hst and is agreeable to repeat. New orders placed. NFN

## 2025-01-18 ENCOUNTER — Ambulatory Visit: Payer: Self-pay | Admitting: Primary Care

## 2025-01-18 NOTE — Progress Notes (Signed)
 Please let patient know sleep study showed mild OSA, she had an average of 11 apneas per hour. Please set up visit to discuss CPAP due to excessive daytime sleepiness

## 2025-01-27 ENCOUNTER — Ambulatory Visit

## 2025-01-27 DIAGNOSIS — R0683 Snoring: Secondary | ICD-10-CM

## 2025-01-29 ENCOUNTER — Telehealth: Payer: Self-pay | Admitting: Pulmonary Disease

## 2025-01-29 NOTE — Telephone Encounter (Signed)
 Call patient  Sleep study result  Date of study: 01/28/2025  Impression: Suboptimal study Limited by insomnia with less than 2 hours of sleep recorded. Severe number of events during the recording at 44.3.  Oxygen nadir of 90%.  This is a technically inadequate study-4 hours of good data is required for an optimal assessment.  Recommendation:  If there is significant concern for sleep disordered breathing, an in-lab study is recommended. Further evaluation of insomnia is recommended, study timing should correspond to patient's usual sleep period. Avoid alcohol, sedatives and other CNS depressants that may worsen sleep apnea and disrupt normal sleep architecture. Sleep hygiene should be reviewed to assess factors that may improve sleep quality. Weight management and regular exercise should be initiated or continued

## 2025-02-02 NOTE — Telephone Encounter (Signed)
 Home sleep study inconclusive due to limited sleep data. Please schedule follow-up to address insomnia and potential need for repeat sleep testing

## 2025-02-02 NOTE — Progress Notes (Unsigned)
 "  [facility]pert[facility] Initial Assessment:    Date:  02/03/2025   ID:  Suzanne Casey, DOB [birth year], MRN [id number]  PCP:  Billy Philippe SAUNDERS, NP  Cardiologist:  None  Nephrologist:  Referring MD: Billy Philippe SAUNDERS, NP   CC: Hypertension  History of Present Illness:    Suzanne Casey is a 39 y.o. female with a hx of hypertension. Here to establish care in the [facility].   Prior echo 12/22/24 normal LVEF 60-65%, no RWMA, RV normal, no significant valvular abnormalities.   Sleep study earlier this year with mild OSA though also limited sleep data, has follow up with pulmonology.   Suzanne Casey was diagnosed with hypertension in setting of preeclampsia 2013. Was initially on Labetolol. In 2019 her BP was controlled on Lisinopril -hydrochlorothiazide  but she was lost to routine medical care from 2019 until 09/2024. After re-establishing with primary care she has been started on Valsartan  and up-titrated.   Not monitoring BP at home, her present cuff was found to be inaccurate.  she reports tobacco use daily 0.5 PPD. Alcohol use socially once per month. For exercise she she enjoys walking predominantly at work. She works as a investment banker, operational for [employer] and does try to take the stairs when possible. she eats at home and outside of the home and does follow low sodium diet. She avoids over the counter agents. She drinks 2-3 mountain dews throughout day.   With high BP will have headache behind her left eye. Previously had some nausea with high BP readings.    Lab Results  Component Value Date   TSH 0.47 10/13/2024    No results found for: LIPOA    Previous antihypertensives: Lisinopril  - hydrochlorothiazide  Labetolol - used during pregnancy  Secondary Causes of Hypertension Renal artery stenosis Hyperaldosteronism  Past Medical History:  Diagnosis Date   Anxiety    Depression    GERD (gastroesophageal reflux disease)    Hypertension    Vitamin B12  deficiency    Vitamin D  deficiency     Past Surgical History:  Procedure Laterality Date   APPENDECTOMY     CHOLECYSTECTOMY      Current Medications: Active Medications[1]   Allergies:   Patient has no known allergies.   Social History   Socioeconomic History   Marital status: Single    Spouse name: Not on file   Number of children: 1   Years of education: Not on file   Highest education level: 11th grade  Occupational History   Not on file  Tobacco Use   Smoking status: Every Day    Current packs/day: 0.25    Average packs/day: 0.3 packs/day for 10.0 years (2.5 ttl pk-yrs)    Types: Cigarettes    Passive exposure: Current   Smokeless tobacco: Never  Vaping Use   Vaping status: Never Used  Substance and Sexual Activity   Alcohol use: Not Currently    Alcohol/week: 2.0 standard drinks of alcohol    Types: 2 Glasses of wine per week    Comment: rarely   Drug use: Never   Sexual activity: Yes    Birth control/protection: I.U.D.  Other Topics Concern   Not on file  Social History Narrative   Not on file   Social Drivers of Health   Tobacco Use: High Risk (02/03/2025)   Patient History    Smoking Tobacco Use: Every Day    Smokeless Tobacco Use: Never    Passive Exposure: Current  Physicist, Medical  Strain: Medium Risk (02/03/2025)   Overall Financial Resource Strain (CARDIA)    Difficulty of Paying Living Expenses: Somewhat hard  Food Insecurity: Food Insecurity Present (02/03/2025)   Epic    Worried About Programme Researcher, Broadcasting/film/video in the Last Year: Sometimes true    Ran Out of Food in the Last Year: Sometimes true  Transportation Needs: Unmet Transportation Needs (02/03/2025)   Epic    Lack of Transportation (Medical): Yes    Lack of Transportation (Non-Medical): Yes  Physical Activity: Insufficiently Active (02/03/2025)   Exercise Vital Sign    Days of Exercise per Week: 4 days    Minutes of Exercise per Session: 30 min  Stress: Stress Concern Present (02/03/2025)    [employer] of Occupational Health - Occupational Stress Questionnaire    Feeling of Stress: To some extent  Social Connections: Moderately Isolated (02/03/2025)   Social Connection and Isolation Panel    Frequency of Communication with Friends and Family: Three times a week    Frequency of Social Gatherings with Friends and Family: Never    Attends Religious Services: Never    Database Administrator or Organizations: Yes    Attends Banker Meetings: Never    Marital Status: Never married  Depression (PHQ2-9): Low Risk (12/09/2024)   Depression (PHQ2-9)    PHQ-2 Score: 0  Alcohol Screen: Low Risk (02/03/2025)   Alcohol Screen    Last Alcohol Screening Score (AUDIT): 2  Housing: High Risk (02/03/2025)   Epic    Unable to Pay for Housing in the Last Year: Yes    Number of Times Moved in the Last Year: 2    Homeless in the Last Year: No  Utilities: Not At Risk (02/03/2025)   Epic    Threatened with loss of utilities: No  Health Literacy: Adequate Health Literacy (02/03/2025)   B1300 Health Literacy    Frequency of need for help with medical instructions: Never     Family History: The patient's family history includes Alcohol abuse in her brother; Breast cancer in her maternal grandmother and mother; COPD in her maternal grandmother; Cancer in her mother; Depression in her mother; Healthy in her father; Hearing loss in her mother; Heart attack in her maternal grandfather and maternal grandmother; Hyperlipidemia in her mother; Hypertension in her mother; Stroke in her maternal grandfather, maternal grandmother, and mother.  ROS:   Please see the history of present illness.     All other systems reviewed and are negative.  EKGs/Labs/Other Studies Reviewed:    EKG Interpretation Date/Time:  Thursday February 03 2025 09:02:29 EST Ventricular Rate:  87 PR Interval:  168 QRS Duration:  88 QT Interval:  362 QTC Calculation: 435 R Axis:   68  Text Interpretation: Normal  sinus rhythm  No acute ST/T wave changes Confirmed by Vannie Mora ([id number]) on 02/03/2025 9:03:18 AM    Recent Labs: 10/13/2024: ALT 16; BUN 11; Creatinine, Ser 0.66; Hemoglobin 15.0; Platelets 392.0; Potassium 3.6; Sodium 138; TSH 0.47   Recent Lipid Panel    Component Value Date/Time   CHOL 190 10/13/2024 0950   TRIG 101.0 10/13/2024 0950   HDL 39.10 10/13/2024 0950   CHOLHDL 5 10/13/2024 0950   VLDL 20.2 10/13/2024 0950   LDLCALC 131 (H) 10/13/2024 0950    Physical Exam:   VS:  BP (!) 140/84 (BP Location: Left Arm, Patient Position: Sitting, Cuff Size: Large)   Pulse 84   Ht 5' 6.5 (1.689 m)   Wt 268 lb (  121.6 kg)   SpO2 95%   BMI 42.61 kg/m  , BMI Body mass index is 42.61 kg/m. GENERAL:  Well appearing, overweight HEENT: Pupils equal round and reactive, fundi not visualized, oral mucosa unremarkable NECK:  No jugular venous distention, waveform within normal limits, carotid upstroke brisk and symmetric, no bruits, no thyromegaly LYMPHATICS:  No cervical adenopathy LUNGS:  Clear to auscultation bilaterally HEART:  RRR.  PMI not displaced or sustained,S1 and S2 within normal limits, no S3, no S4, no clicks, no rubs, no murmurs ABD:  Flat, positive bowel sounds normal in frequency in pitch, no bruits, no rebound, no guarding, no midline pulsatile mass, no hepatomegaly, no splenomegaly EXT:  2 plus pulses throughout, no edema, no cyanosis no clubbing SKIN:  No rashes no nodules NEURO:  Cranial nerves II through XII grossly intact, motor grossly intact throughout PSYCH:  Cognitively intact, oriented to person place and time   ASSESSMENT/PLAN:    HTN - BP nearly at goal. Stop amlodipine , valsartan , hydrochlorothiazide .  Start amlodipine -valsartan -hydrochlorothiazide  10-320-25mg  daily Labs today: renin aldosterone Plan for renal artery duplex to rule out renal artery stenosis. Rx sent to [facility] for BP cuff through Medicaid benefit MyChart message in 2 weeks to  check in on home BP. If BP at home not at goal <130/80, will plan to start Spironolactone.   Hx of preeclampsia / HLD - Preeclampsia is risk factor for CVD. Lipids elevated as far back as 2014 LDL 112 ? 2017 LDL 121 ? 2025 LDL 131. Plan for Lp(a). If elevated, plan to initiate statin for prevention benefit.   Screening for Secondary Hypertension:     Relevant Labs/Studies:    Latest Ref Rng & Units 10/13/2024    9:50 AM 06/21/2022    2:21 PM 12/15/2018    3:47 AM  Basic Labs  Sodium 135 - 145 mEq/L 138  139  139   Potassium 3.5 - 5.1 mEq/L 3.6  3.4  3.8   Creatinine 0.40 - 1.20 mg/dL 9.33  9.37  9.47        Latest Ref Rng & Units 10/13/2024    9:50 AM  Thyroid    TSH 0.35 - 5.50 uIU/mL 0.47                 02/03/2025    9:41 AM  Renovascular   Renal Artery US  Completed Yes     Disposition:    FU with MD/APP/PharmD in 2 months    Medication Adjustments/Labs and Tests Ordered: Current medicines are reviewed at length with the patient today.  Concerns regarding medicines are outlined above.  Orders Placed This Encounter  Procedures   Aldosterone + renin activity w/ ratio   Lipoprotein A (LPA)   Referral to HRT/VAS Care Navigation   EKG 12-Lead   VAS US  RENAL ARTERY DUPLEX   Meds ordered this encounter  Medications   amLODIPine -Valsartan -HCTZ 10-320-25 MG TABS    Sig: Take 1 tablet by mouth daily.    Dispense:  90 tablet    Refill:  1    Okay to fill as 30 or 90 day    Supervising Provider:   LONNI SLAIN [[id number]]   Blood Pressure Monitoring (BLOOD PRESSURE KIT) DEVI    Sig: Take blood pressure two times daily    Dispense:  1 each    Refill:  0    Supervising Provider:   LONNI SLAIN [[id number]]     Signed, Reche GORMAN Finder, NP  02/03/2025 11:11 AM  [facility]     [1]  Current Meds  Medication Sig   amLODIPine -Valsartan -HCTZ 10-320-25 MG TABS Take 1 tablet by mouth daily.   "

## 2025-02-02 NOTE — Telephone Encounter (Signed)
 Called and got patient scheduled to see Landry Ferrari.NFN,informed of results

## 2025-02-02 NOTE — Progress Notes (Signed)
 ATC x1 to review sleep study. Mailbox was full, so unable to leave vm.

## 2025-02-03 ENCOUNTER — Encounter (HOSPITAL_BASED_OUTPATIENT_CLINIC_OR_DEPARTMENT_OTHER): Payer: Self-pay | Admitting: Family

## 2025-02-03 ENCOUNTER — Ambulatory Visit (INDEPENDENT_AMBULATORY_CARE_PROVIDER_SITE_OTHER): Admitting: Family

## 2025-02-03 ENCOUNTER — Other Ambulatory Visit (HOSPITAL_BASED_OUTPATIENT_CLINIC_OR_DEPARTMENT_OTHER): Payer: Self-pay

## 2025-02-03 VITALS — BP 140/84 | HR 84 | Ht 66.5 in | Wt 268.0 lb

## 2025-02-03 DIAGNOSIS — E782 Mixed hyperlipidemia: Secondary | ICD-10-CM

## 2025-02-03 DIAGNOSIS — I10 Essential (primary) hypertension: Secondary | ICD-10-CM

## 2025-02-03 MED ORDER — AMLODIPINE-VALSARTAN-HCTZ 10-320-25 MG PO TABS
1.0000 | ORAL_TABLET | Freq: Every day | ORAL | 1 refills | Status: AC
  Filled 2025-02-03: qty 30, 30d supply, fill #0

## 2025-02-03 MED ORDER — BLOOD PRESSURE KIT DEVI: 0 refills | Status: AC

## 2025-02-03 NOTE — Patient Instructions (Addendum)
 Medication Instructions:   STOP Amlodipine  STOP Valsartan  STOP hydrochlorothiazide   START Amlodipine -Valsartan -hydrochlorothiazide  10-320-25mg  daily   Labwork: Your physician recommends that you return for lab work today: renin-aldosterone, lp(a)   Testing/Procedures: Your physician has requested that you have a renal artery duplex. During this test, an ultrasound is used to evaluate blood flow to the kidneys. Allow one hour for this exam. Do not eat after midnight the day before and avoid carbonated beverages. Take your medications as you usually do.    Follow-Up: Please follow up in 2 months in [facility] with Dr. Raford, Reche Finder, NP or Allean Mink PharmD    Special Instructions:   Reche GORMAN Finder, NP will send MyChart message in 2 weeks to check in on blood pressure. If still consistently more than 130/80 at home, we will plan to add an additional medication.

## 2025-02-03 NOTE — Progress Notes (Signed)
 " Heart and Vascular Care Navigation  02/03/2025  Jasia Hiltunen May 03, 1986 [id number]  Reason for Referral: food resources Patient is participating in a Managed Medicaid Plan:Yes  Engaged with patient face to face for initial visit for Heart and Vascular Care Coordination.                                                                                                   Assessment:                       LCSW met with pt today after appt at request of provider. Introduced self, role, reason for visit. Confirmed full name and DOB. Updated address to the PO Box they're currently using, residing at Extended Stay on [address]. 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 Emergency contact okay to remain her friend, okay currently with covering cost of Extended Stay. Uses friends/Uber/Lyft to get to and from appts, was not aware of benefits through her insurance plan that can take her to and from medical appts/pharmacy. Currently does not receive any SNAP benefits- ended in August likely needs to re-certify/re-apply at this time due to time passed. Provided assistance with bag today of HRT/VAS pantry items and we discussed resources including food pantry list and [employer] distribution schedule. No additional questions today, encouraged her to call me as needed.               HRT/VAS Care Coordination     Patients Home Cardiology Office [facility]   Outpatient Care Team Social Worker   Social Worker Name: Marit Lark, [place], [phone]   Living arrangements for the past 2 months Hotel/Motel   Lives with: Minor Children   Patient Current Insurance Coverage Medicaid   Patient Has Concern With Paying Medical Bills No   Does Patient Have Prescription Coverage? Yes       Social History:                                                                             SDOH Screenings   Food Insecurity: Food Insecurity Present (02/03/2025)  Housing: High Risk (02/03/2025)   Transportation Needs: Unmet Transportation Needs (02/03/2025)  Utilities: Not At Risk (02/03/2025)  Alcohol Screen: Low Risk (02/03/2025)  Depression (PHQ2-9): Low Risk (12/09/2024)  Financial Resource Strain: Medium Risk (02/03/2025)  Physical Activity: Insufficiently Active (02/03/2025)  Social Connections: Moderately Isolated (02/03/2025)  Stress: Stress Concern Present (02/03/2025)  Tobacco Use: High Risk (02/03/2025)  Health Literacy: Adequate Health Literacy (02/03/2025)    SDOH Interventions: Financial Resources:   Architect: will send additional housing resources; DSS for financial assistance  Food Insecurity:  Food Insecurity Interventions: [employer] Provided, Other (Comment) (pt needs to re-certify for [employer], will send [place]  card and provided resources for local food assistance including [facility]; pt provided HRT/VAS Food pantry bag)  Housing Insecurity:  Housing Interventions: [employer] Provided, Other (Comment) (will send additional housing resources but at this time per pt she is doing okay with payments for extended stay room)  Transportation:   Transportation Interventions: [employer] Provided, Associate Professor (discussed access to transportation for pharmacy/medical appts with Medicaid; will send additional transportation resources)    Other Care Navigation Interventions:     Provided Pharmacy assistance resources  Pt has medicaid- $4 copays for medications; can be placed on charge account if needed  Patient expressed Mental Health concerns No. However stress noted on assessment tool, will send additional resources and pt provided my card as needed   Follow-up plan:   LCSW was able provide my card, information about local food resources and [employer] the [place]. Will mail additional supportive community resources, pt was encouraged to call me as needed and I will f/u to ensure no additional  questions/concerns arise.      "

## 2025-02-03 NOTE — Progress Notes (Signed)
 ATC X2. Unable to lvm as mb is full. Pt is scheduled to see Landry Ferrari, NP on 02-07-2025.

## 2025-02-07 ENCOUNTER — Ambulatory Visit: Admitting: Primary Care

## 2025-02-10 ENCOUNTER — Ambulatory Visit: Admitting: Family Medicine

## 2025-03-10 ENCOUNTER — Encounter (HOSPITAL_BASED_OUTPATIENT_CLINIC_OR_DEPARTMENT_OTHER)

## 2025-04-07 ENCOUNTER — Encounter (HOSPITAL_BASED_OUTPATIENT_CLINIC_OR_DEPARTMENT_OTHER): Admitting: Family
